# Patient Record
Sex: Female | Born: 1990 | Hispanic: No | Marital: Single | Smoking: Former smoker
Health system: Southern US, Community
[De-identification: ages and names within clinical notes are randomized; demographics above are authoritative.]

## PROBLEM LIST (undated history)

## (undated) DIAGNOSIS — R569 Unspecified convulsions: Secondary | ICD-10-CM

## (undated) DIAGNOSIS — F410 Panic disorder [episodic paroxysmal anxiety] without agoraphobia: Secondary | ICD-10-CM

## (undated) DIAGNOSIS — F445 Conversion disorder with seizures or convulsions: Secondary | ICD-10-CM

## (undated) DIAGNOSIS — G43909 Migraine, unspecified, not intractable, without status migrainosus: Secondary | ICD-10-CM

## (undated) DIAGNOSIS — Z8619 Personal history of other infectious and parasitic diseases: Secondary | ICD-10-CM

## (undated) DIAGNOSIS — F329 Major depressive disorder, single episode, unspecified: Secondary | ICD-10-CM

## (undated) DIAGNOSIS — I519 Heart disease, unspecified: Secondary | ICD-10-CM

## (undated) DIAGNOSIS — J45909 Unspecified asthma, uncomplicated: Secondary | ICD-10-CM

## (undated) HISTORY — DX: Major depressive disorder, single episode, unspecified: F32.9

## (undated) HISTORY — DX: Heart disease, unspecified: I51.9

## (undated) HISTORY — DX: Migraine, unspecified, not intractable, without status migrainosus: G43.909

---
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
## 2012-09-01 ENCOUNTER — Emergency Department: Payer: Self-pay | Admitting: Emergency Medicine

## 2012-09-01 LAB — CBC
HCT: 33.9 % — ABNORMAL LOW (ref 35.0–47.0)
HGB: 10.8 g/dL — ABNORMAL LOW (ref 12.0–16.0)
MCH: 26.4 pg (ref 26.0–34.0)
MCHC: 31.9 g/dL — ABNORMAL LOW (ref 32.0–36.0)
MCV: 83 fL (ref 80–100)
Platelet: 290 10*3/uL (ref 150–440)
RBC: 4.1 10*6/uL (ref 3.80–5.20)
RDW: 17.9 % — ABNORMAL HIGH (ref 11.5–14.5)
WBC: 8.1 10*3/uL (ref 3.6–11.0)

## 2012-09-02 LAB — COMPREHENSIVE METABOLIC PANEL
Albumin: 3.9 g/dL (ref 3.8–5.6)
Alkaline Phosphatase: 106 U/L (ref 82–169)
Anion Gap: 6 — ABNORMAL LOW (ref 7–16)
BUN: 5 mg/dL — ABNORMAL LOW (ref 7–18)
Bilirubin,Total: 0.3 mg/dL (ref 0.2–1.0)
Calcium, Total: 9.2 mg/dL (ref 9.0–10.7)
Chloride: 109 mmol/L — ABNORMAL HIGH (ref 98–107)
Co2: 26 mmol/L (ref 21–32)
Creatinine: 0.79 mg/dL (ref 0.60–1.30)
EGFR (African American): >60
EGFR (Non-African Amer.): >60
Glucose: 65 mg/dL (ref 65–99)
Osmolality: 277 (ref 275–301)
Potassium: 3.3 mmol/L — ABNORMAL LOW (ref 3.5–5.1)
SGOT(AST): 21 U/L (ref 0–26)
SGPT (ALT): 23 U/L (ref 12–78)
Sodium: 141 mmol/L (ref 136–145)
Total Protein: 8.1 g/dL (ref 6.4–8.6)

## 2012-09-02 LAB — URINALYSIS, COMPLETE
Bilirubin,UR: NEGATIVE
Glucose,UR: NEGATIVE mg/dL (ref 0–75)
Ketone: NEGATIVE
Nitrite: NEGATIVE
Ph: 5 (ref 4.5–8.0)
Protein: NEGATIVE
RBC,UR: 4 /HPF (ref 0–5)
Specific Gravity: 1.014 (ref 1.003–1.030)
Squamous Epithelial: 2
WBC UR: 3 /HPF (ref 0–5)

## 2012-09-02 LAB — LIPASE, BLOOD: Lipase: 203 U/L (ref 73–393)

## 2012-12-01 ENCOUNTER — Emergency Department: Payer: Self-pay | Admitting: Emergency Medicine

## 2012-12-01 LAB — URINALYSIS, COMPLETE
Bacteria: NONE SEEN
Bilirubin,UR: NEGATIVE
Glucose,UR: NEGATIVE mg/dL (ref 0–75)
Ketone: NEGATIVE
Leukocyte Esterase: NEGATIVE
Nitrite: NEGATIVE
Ph: 6 (ref 4.5–8.0)
Protein: NEGATIVE
RBC,UR: 1 /HPF (ref 0–5)
Specific Gravity: 1.005 (ref 1.003–1.030)
Squamous Epithelial: 1
WBC UR: 1 /HPF (ref 0–5)

## 2012-12-01 LAB — CBC
HCT: 38.9 % (ref 35.0–47.0)
HGB: 12.7 g/dL (ref 12.0–16.0)
MCH: 27.2 pg (ref 26.0–34.0)
MCHC: 32.6 g/dL (ref 32.0–36.0)
MCV: 83 fL (ref 80–100)
Platelet: 257 10*3/uL (ref 150–440)
RBC: 4.66 10*6/uL (ref 3.80–5.20)
RDW: 17.7 % — ABNORMAL HIGH (ref 11.5–14.5)
WBC: 8.4 10*3/uL (ref 3.6–11.0)

## 2012-12-01 LAB — BASIC METABOLIC PANEL
Anion Gap: 4 — ABNORMAL LOW (ref 7–16)
BUN: 4 mg/dL — ABNORMAL LOW (ref 7–18)
Calcium, Total: 8.9 mg/dL — ABNORMAL LOW (ref 9.0–10.7)
Chloride: 107 mmol/L (ref 98–107)
Co2: 26 mmol/L (ref 21–32)
Creatinine: 0.7 mg/dL (ref 0.60–1.30)
EGFR (African American): >60
EGFR (Non-African Amer.): >60
Glucose: 88 mg/dL (ref 65–99)
Osmolality: 270 (ref 275–301)
Potassium: 3.5 mmol/L (ref 3.5–5.1)
Sodium: 137 mmol/L (ref 136–145)

## 2012-12-02 LAB — HEPATIC FUNCTION PANEL A (ARMC)
Albumin: 3.2 g/dL — ABNORMAL LOW (ref 3.8–5.6)
Alkaline Phosphatase: 109 U/L (ref 82–169)
Bilirubin, Direct: <0.1 mg/dL (ref 0.00–0.20)
Bilirubin,Total: 0.3 mg/dL (ref 0.2–1.0)
SGOT(AST): 19 U/L (ref 0–26)
SGPT (ALT): 28 U/L (ref 12–78)
Total Protein: 7.6 g/dL (ref 6.4–8.6)

## 2012-12-02 LAB — WET PREP, GENITAL

## 2012-12-02 LAB — LIPASE, BLOOD: Lipase: 126 U/L (ref 73–393)

## 2012-12-02 LAB — GC/CHLAMYDIA PROBE AMP

## 2012-12-13 ENCOUNTER — Emergency Department: Payer: Self-pay | Admitting: Emergency Medicine

## 2012-12-13 LAB — URINALYSIS, COMPLETE
Bacteria: NONE SEEN
Bilirubin,UR: NEGATIVE
Glucose,UR: NEGATIVE mg/dL (ref 0–75)
Ketone: NEGATIVE
Leukocyte Esterase: NEGATIVE
Nitrite: NEGATIVE
Ph: 5 (ref 4.5–8.0)
Protein: NEGATIVE
RBC,UR: 40 /HPF (ref 0–5)
Specific Gravity: 1.012 (ref 1.003–1.030)
Squamous Epithelial: 19
WBC UR: 5 /HPF (ref 0–5)

## 2012-12-13 LAB — BASIC METABOLIC PANEL
Anion Gap: 5 — ABNORMAL LOW (ref 7–16)
BUN: 6 mg/dL — ABNORMAL LOW (ref 7–18)
Calcium, Total: 9.2 mg/dL (ref 9.0–10.7)
Chloride: 106 mmol/L (ref 98–107)
Co2: 28 mmol/L (ref 21–32)
Creatinine: 0.85 mg/dL (ref 0.60–1.30)
EGFR (African American): >60
EGFR (Non-African Amer.): >60
Glucose: 82 mg/dL (ref 65–99)
Osmolality: 274 (ref 275–301)
Potassium: 3.4 mmol/L — ABNORMAL LOW (ref 3.5–5.1)
Sodium: 139 mmol/L (ref 136–145)

## 2012-12-13 LAB — DRUG SCREEN, URINE

## 2012-12-13 LAB — PREGNANCY, URINE: Pregnancy Test, Urine: NEGATIVE m[IU]/mL

## 2012-12-14 LAB — CBC WITH DIFFERENTIAL/PLATELET
Basophil #: 0 10*3/uL (ref 0.0–0.1)
Basophil %: 0.4 %
Eosinophil #: 0.1 10*3/uL (ref 0.0–0.7)
Eosinophil %: 1.4 %
HCT: 40.4 % (ref 35.0–47.0)
HGB: 13.4 g/dL (ref 12.0–16.0)
Lymphocyte #: 2.9 10*3/uL (ref 1.0–3.6)
Lymphocyte %: 34 %
MCH: 27.6 pg (ref 26.0–34.0)
MCHC: 33 g/dL (ref 32.0–36.0)
MCV: 84 fL (ref 80–100)
Monocyte #: 0.7 x10 3/mm (ref 0.2–0.9)
Monocyte %: 8.6 %
Neutrophil #: 4.7 10*3/uL (ref 1.4–6.5)
Neutrophil %: 55.6 %
Platelet: 288 10*3/uL (ref 150–440)
RBC: 4.84 10*6/uL (ref 3.80–5.20)
RDW: 17 % — ABNORMAL HIGH (ref 11.5–14.5)
WBC: 8.4 10*3/uL (ref 3.6–11.0)

## 2015-05-27 ENCOUNTER — Emergency Department (HOSPITAL_COMMUNITY)
Admission: EM | Admit: 2015-05-27 | Discharge: 2015-05-27 | Disposition: A | Discharge status: Home / Self Care | Payer: Self-pay | Attending: Emergency Medicine | Admitting: Emergency Medicine

## 2015-05-27 ENCOUNTER — Encounter (HOSPITAL_COMMUNITY): Payer: Self-pay | Admitting: Emergency Medicine

## 2015-05-27 ENCOUNTER — Emergency Department (HOSPITAL_COMMUNITY): Payer: Self-pay

## 2015-05-27 DIAGNOSIS — R404 Transient alteration of awareness: Secondary | ICD-10-CM | POA: Insufficient documentation

## 2015-05-27 DIAGNOSIS — R109 Unspecified abdominal pain: Secondary | ICD-10-CM | POA: Insufficient documentation

## 2015-05-27 DIAGNOSIS — Z3202 Encounter for pregnancy test, result negative: Secondary | ICD-10-CM | POA: Insufficient documentation

## 2015-05-27 HISTORY — DX: Unspecified convulsions: R56.9

## 2015-05-27 HISTORY — DX: Unspecified asthma, uncomplicated: J45.909

## 2015-05-27 HISTORY — DX: Panic disorder (episodic paroxysmal anxiety): F41.0

## 2015-05-27 HISTORY — DX: Conversion disorder with seizures or convulsions: F44.5

## 2015-05-27 LAB — RAPID URINE DRUG SCREEN, HOSP PERFORMED
Amphetamines: NOT DETECTED
Barbiturates: NOT DETECTED
Benzodiazepines: NOT DETECTED
Cocaine: NOT DETECTED
Opiates: NOT DETECTED
Tetrahydrocannabinol: NOT DETECTED

## 2015-05-27 LAB — COMPREHENSIVE METABOLIC PANEL
ALT: 13 U/L — ABNORMAL LOW (ref 14–54)
AST: 17 U/L (ref 15–41)
Albumin: 3.9 g/dL (ref 3.5–5.0)
Alkaline Phosphatase: 82 U/L (ref 38–126)
Anion gap: 8 (ref 5–15)
BUN: 7 mg/dL (ref 6–20)
CO2: 26 mmol/L (ref 22–32)
Calcium: 9.2 mg/dL (ref 8.9–10.3)
Chloride: 105 mmol/L (ref 101–111)
Creatinine, Ser: 0.92 mg/dL (ref 0.44–1.00)
GFR calc Af Amer: >60 mL/min (ref >60)
GFR calc non Af Amer: >60 mL/min (ref >60)
Glucose, Bld: 101 mg/dL — ABNORMAL HIGH (ref 65–99)
Potassium: 3.5 mmol/L (ref 3.5–5.1)
Sodium: 139 mmol/L (ref 135–145)
Total Bilirubin: 0.5 mg/dL (ref 0.3–1.2)
Total Protein: 7.8 g/dL (ref 6.5–8.1)

## 2015-05-27 LAB — CBC WITH DIFFERENTIAL/PLATELET
Basophils Absolute: 0 10*3/uL (ref 0.0–0.1)
Basophils Relative: 0 %
Eosinophils Absolute: 0.1 10*3/uL (ref 0.0–0.7)
Eosinophils Relative: 1 %
HCT: 35.3 % — ABNORMAL LOW (ref 36.0–46.0)
Hemoglobin: 11.2 g/dL — ABNORMAL LOW (ref 12.0–15.0)
Lymphocytes Relative: 39 %
Lymphs Abs: 3.5 10*3/uL (ref 0.7–4.0)
MCH: 27.6 pg (ref 26.0–34.0)
MCHC: 31.7 g/dL (ref 30.0–36.0)
MCV: 86.9 fL (ref 78.0–100.0)
Monocytes Absolute: 0.9 10*3/uL (ref 0.1–1.0)
Monocytes Relative: 9 %
Neutro Abs: 4.6 10*3/uL (ref 1.7–7.7)
Neutrophils Relative %: 51 %
Platelets: 309 10*3/uL (ref 150–400)
RBC: 4.06 MIL/uL (ref 3.87–5.11)
RDW: 15.5 % (ref 11.5–15.5)
WBC: 9 10*3/uL (ref 4.0–10.5)

## 2015-05-27 LAB — I-STAT CHEM 8, ED
BUN: 4 mg/dL — ABNORMAL LOW (ref 6–20)
Calcium, Ion: 1.18 mmol/L (ref 1.12–1.23)
Chloride: 102 mmol/L (ref 101–111)
Creatinine, Ser: 0.9 mg/dL (ref 0.44–1.00)
Glucose, Bld: 100 mg/dL — ABNORMAL HIGH (ref 65–99)
HCT: 39 % (ref 36.0–46.0)
Hemoglobin: 13.3 g/dL (ref 12.0–15.0)
Potassium: 3.5 mmol/L (ref 3.5–5.1)
Sodium: 141 mmol/L (ref 135–145)
TCO2: 25 mmol/L (ref 0–100)

## 2015-05-27 LAB — URINE MICROSCOPIC-ADD ON: RBC / HPF: NONE SEEN RBC/hpf (ref 0–5)

## 2015-05-27 LAB — URINALYSIS, ROUTINE W REFLEX MICROSCOPIC
Bilirubin Urine: NEGATIVE
Glucose, UA: NEGATIVE mg/dL
Hgb urine dipstick: NEGATIVE
Ketones, ur: NEGATIVE mg/dL
Nitrite: NEGATIVE
Protein, ur: NEGATIVE mg/dL
Specific Gravity, Urine: 1.009 (ref 1.005–1.030)
pH: 6.5 (ref 5.0–8.0)

## 2015-05-27 LAB — I-STAT BETA HCG BLOOD, ED (MC, WL, AP ONLY): I-stat hCG, quantitative: <5 m[IU]/mL (ref <5)

## 2015-05-27 LAB — ACETAMINOPHEN LEVEL: Acetaminophen (Tylenol), Serum: <10 ug/mL — ABNORMAL LOW (ref 10–30)

## 2015-05-27 LAB — CBG MONITORING, ED: Glucose-Capillary: 109 mg/dL — ABNORMAL HIGH (ref 65–99)

## 2015-05-27 LAB — ETHANOL: Alcohol, Ethyl (B): <5 mg/dL (ref <5)

## 2015-05-27 LAB — SALICYLATE LEVEL: Salicylate Lvl: <4 mg/dL (ref 2.8–30.0)

## 2015-05-27 MED ORDER — NALOXONE HCL 0.4 MG/ML IJ SOLN
INTRAMUSCULAR | Status: AC
  Administered 2015-05-27: 0.4 mg
  Filled 2015-05-27: qty 1

## 2015-05-27 NOTE — ED Provider Notes (Signed)
CSN: [id number]     Arrival date & time 05/27/15  0018 History  By signing my name below, I, Michelle Lucas, attest that this documentation has been prepared under the direction and in the presence of Crist Kruszka, MD. Electronically Signed: Gonzella LexKimberly Bianca Lucas, Scribe. 05/27/2015. 1:05 AM.   Chief Complaint  Patient presents with  . Seizures   The history is provided by the patient. The history is limited by the condition of the patient. No language interpreter was used.    HPI Comments:  Michelle Lucas is a 25 y.o. female with a hx of seizures and a hx of various psychiatric complaints,who presents to the Emergency Department complaining of sudden onset of abdominal pain which began this evening. Pt was initially brought in to the ED due to suspicion of a seizure. Pt is awake but is not responding to questions she is asked.  Apparently the patient was having sex and enjoying her self per family report and then had an episode.  Patient reported to nurse that she has been seen by neurology and told she does not have seizures and was directed to a counselor.  History reviewed. No pertinent past medical history. No past surgical history on file. History reviewed. No pertinent family history.   Social History  Substance Use Topics  . Smoking status: Not on file  . Smokeless tobacco: Not on file  . Alcohol Use: Not on file   OB History    No data available     Review of Systems  Constitutional: Negative for fever.  Gastrointestinal: Positive for abdominal pain.  Neurological: Negative for dizziness, syncope, facial asymmetry, speech difficulty, light-headedness and numbness.  Hematological: Negative for adenopathy.  All other systems reviewed and are negative.  Allergies  Review of patient's allergies indicates not on file.  Home Medications   Prior to Admission medications   Not on File   There were no vitals taken for this visit. Physical Exam  Constitutional: She is  oriented to person, place, and time. She appears well-developed and well-nourished. No distress.  HENT:  Head: Normocephalic and atraumatic.  Mouth/Throat: Oropharynx is clear and moist.  Intact gag  Eyes: Conjunctivae and EOM are normal. Pupils are equal, round, and reactive to light.  Pinpoint pupils   Neck: Normal range of motion. Neck supple.  Cardiovascular: Normal rate.   Tachycardic but pauses every so often  Pulmonary/Chest: Effort normal. No stridor. No respiratory distress. She has no wheezes. She has no rales.  Lung sounds diminished  Abdominal: Soft. Bowel sounds are normal. She exhibits no distension. There is no tenderness. There is no rebound and no guarding.  Musculoskeletal: Normal range of motion.   Intact dorsalis pedis  Lymphadenopathy:    She has no cervical adenopathy.  Neurological: She is alert and oriented to person, place, and time. She has normal reflexes. No cranial nerve deficit.  Skin: Skin is warm and dry.  Psychiatric: She has a normal mood and affect.  Nursing note and vitals reviewed.   ED Course  Procedures  DIAGNOSTIC STUDIES:       COORDINATION OF CARE:  12:27 AM Will order lab work and will administer narcan in the ED.  Discussed treatment plan with pt at bedside and pt agreed to plan.   Labs Review Labs Reviewed  CBC WITH DIFFERENTIAL/PLATELET - Abnormal; Notable for the following:    Hemoglobin 11.2 (*)    HCT 35.3 (*)    All other components within normal limits  COMPREHENSIVE METABOLIC PANEL - Abnormal; Notable for the following:    Glucose, Bld 101 (*)    ALT 13 (*)    All other components within normal limits  ACETAMINOPHEN LEVEL - Abnormal; Notable for the following:    Acetaminophen (Tylenol), Serum <10 (*)    All other components within normal limits  URINALYSIS, ROUTINE W REFLEX MICROSCOPIC (NOT AT [facility]) - Abnormal; Notable for the following:    Leukocytes, UA TRACE (*)    All other components within normal limits   URINE MICROSCOPIC-ADD ON - Abnormal; Notable for the following:    Squamous Epithelial / LPF 0-5 (*)    Bacteria, UA RARE (*)    All other components within normal limits  I-STAT CHEM 8, ED - Abnormal; Notable for the following:    BUN 4 (*)    Glucose, Bld 100 (*)    All other components within normal limits  CBG MONITORING, ED - Abnormal; Notable for the following:    Glucose-Capillary 109 (*)    All other components within normal limits  URINE RAPID DRUG SCREEN, HOSP PERFORMED  ETHANOL  SALICYLATE LEVEL  I-STAT BETA HCG BLOOD, ED ([facility], [facility], [facility] ONLY)    I have personally reviewed and evaluated these lab results as part of my medical decision-making.  MDM   Final diagnoses:  None    Patient was not unresponsive, when EDP lifted the patient's arm over the patient's head.  The patient held the arm up it did not drop.  Patient sternal rubbed and began complaining of pain immediately.  While I think this is more psychiatric.  No driving until cleared by neurology.  Patient informed of same and it is on the patient's discharge papers  I personally performed the services described in this documentation, which was scribed in my presence. The recorded information has been reviewed and is accurate.        Cy Blamer, MD 05/27/15 [phone]

## 2015-05-27 NOTE — Discharge Instructions (Signed)
NO DRIVING UNTIL CLEARED BY NEUROLOGY

## 2015-05-27 NOTE — ED Notes (Signed)
Verbal order by Dr. Nicanor AlconPalumbo for Narcan

## 2015-05-27 NOTE — ED Notes (Deleted)
Sister stated that pt was having sex with her partner when she had a seizure during intercourse.  Sister stated that patient became unresponsive; pt started to come around but was weak and sister brought her to this facility.

## 2015-05-27 NOTE — ED Notes (Signed)
Sister reported that patient was having intercourse with her partner when patient had an apparent seizure.  Pt was initially unresponsive per sister but she became conscious but weak.  Patient was carried to sister's vehicle and brought to this facility.

## 2015-07-01 ENCOUNTER — Emergency Department
Admission: EM | Admit: 2015-07-01 | Discharge: 2015-07-01 | Disposition: A | Discharge status: Home / Self Care | Payer: Self-pay

## 2015-08-17 ENCOUNTER — Emergency Department: Payer: Self-pay

## 2015-08-17 ENCOUNTER — Emergency Department
Admission: EM | Admit: 2015-08-17 | Discharge: 2015-08-17 | Disposition: A | Discharge status: Home / Self Care | Payer: Self-pay | Attending: Emergency Medicine | Admitting: Emergency Medicine

## 2015-08-17 ENCOUNTER — Encounter: Payer: Self-pay | Admitting: *Deleted

## 2015-08-17 DIAGNOSIS — F17200 Nicotine dependence, unspecified, uncomplicated: Secondary | ICD-10-CM | POA: Insufficient documentation

## 2015-08-17 DIAGNOSIS — F1290 Cannabis use, unspecified, uncomplicated: Secondary | ICD-10-CM | POA: Insufficient documentation

## 2015-08-17 DIAGNOSIS — Z8669 Personal history of other diseases of the nervous system and sense organs: Secondary | ICD-10-CM | POA: Insufficient documentation

## 2015-08-17 DIAGNOSIS — M79672 Pain in left foot: Secondary | ICD-10-CM | POA: Insufficient documentation

## 2015-08-17 DIAGNOSIS — J45909 Unspecified asthma, uncomplicated: Secondary | ICD-10-CM | POA: Insufficient documentation

## 2015-08-17 MED ORDER — IBUPROFEN 800 MG PO TABS: 800.0000 mg | ORAL_TABLET | Freq: Three times a day (TID) | ORAL | Status: DC | PRN

## 2015-08-17 NOTE — ED Notes (Signed)
Pt to ed with c/o left heel pain and left ankle pain and swelling, pt denies injury but states pain worse with ambulation.

## 2015-08-17 NOTE — Discharge Instructions (Signed)
Cryotherapy °Cryotherapy means treatment with cold. Ice or gel packs can be used to reduce both pain and swelling. Ice is the most helpful within the first 24 to 48 hours after an injury or flare-up from overusing a muscle or joint. Sprains, strains, spasms, burning pain, shooting pain, and aches can all be eased with ice. Ice can also be used when recovering from surgery. Ice is effective, has very few side effects, and is safe for most people to use. °PRECAUTIONS  °Ice is not a safe treatment option for people with: °· Raynaud phenomenon. This is a condition affecting small blood vessels in the extremities. Exposure to cold may cause your problems to return. °· Cold hypersensitivity. There are many forms of cold hypersensitivity, including: °· Cold urticaria. Red, itchy hives appear on the skin when the tissues begin to warm after being iced. °· Cold erythema. This is a red, itchy rash caused by exposure to cold. °· Cold hemoglobinuria. Red blood cells break down when the tissues begin to warm after being iced. The hemoglobin that carry oxygen are passed into the urine because they cannot combine with blood proteins fast enough. °· Numbness or altered sensitivity in the area being iced. °If you have any of the following conditions, do not use ice until you have discussed cryotherapy with your caregiver: °· Heart conditions, such as arrhythmia, angina, or chronic heart disease. °· High blood pressure. °· Healing wounds or open skin in the area being iced. °· Current infections. °· Rheumatoid arthritis. °· Poor circulation. °· Diabetes. °Ice slows the blood flow in the region it is applied. This is beneficial when trying to stop inflamed tissues from spreading irritating chemicals to surrounding tissues. However, if you expose your skin to cold temperatures for too long or without the proper protection, you can damage your skin or nerves. Watch for signs of skin damage due to cold. °HOME CARE INSTRUCTIONS °Follow  these tips to use ice and cold packs safely. °· Place a dry or damp towel between the ice and skin. A damp towel will cool the skin more quickly, so you may need to shorten the time that the ice is used. °· For a more rapid response, add gentle compression to the ice. °· Ice for no more than 10 to 20 minutes at a time. The bonier the area you are icing, the less time it will take to get the benefits of ice. °· Check your skin after 5 minutes to make sure there are no signs of a poor response to cold or skin damage. °· Rest 20 minutes or more between uses. °· Once your skin is numb, you can end your treatment. You can test numbness by very lightly touching your skin. The touch should be so light that you do not see the skin dimple from the pressure of your fingertip. When using ice, most people will feel these normal sensations in this order: cold, burning, aching, and numbness. °· Do not use ice on someone who cannot communicate their responses to pain, such as small children or people with dementia. °HOW TO MAKE AN ICE PACK °Ice packs are the most common way to use ice therapy. Other methods include ice massage, ice baths, and cryosprays. Muscle creams that cause a cold, tingly feeling do not offer the same benefits that ice offers and should not be used as a substitute unless recommended by your caregiver. °To make an ice pack, do one of the following: °· Place crushed ice or a   bag of frozen vegetables in a sealable plastic bag. Squeeze out the excess air. Place this bag inside another plastic bag. Slide the bag into a pillowcase or place a damp towel between your skin and the bag.  Mix 3 parts water with 1 part rubbing alcohol. Freeze the mixture in a sealable plastic bag. When you remove the mixture from the freezer, it will be slushy. Squeeze out the excess air. Place this bag inside another plastic bag. Slide the bag into a pillowcase or place a damp towel between your skin and the bag. SEEK MEDICAL CARE  IF:  You develop white spots on your skin. This may give the skin a blotchy (mottled) appearance.  Your skin turns blue or pale.  Your skin becomes waxy or hard.  Your swelling gets worse. MAKE SURE YOU:   Understand these instructions.  Will watch your condition.  Will get help right away if you are not doing well or get worse.   This information is not intended to replace advice given to you by your health care provider. Make sure you discuss any questions you have with your health care provider.   Document Released: 10/29/2010 Document Revised: 03/25/2014 Document Reviewed: 10/29/2010 Elsevier Interactive Patient Education 2016 Elsevier Inc.  Musculoskeletal Pain Musculoskeletal pain is muscle and boney aches and pains. These pains can occur in any part of the body. Your caregiver may treat you without knowing the cause of the pain. They may treat you if blood or urine tests, X-rays, and other tests were normal.  CAUSES There is often not a definite cause or reason for these pains. These pains may be caused by a type of germ (virus). The discomfort may also come from overuse. Overuse includes working out too hard when your body is not fit. Boney aches also come from weather changes. Bone is sensitive to atmospheric pressure changes. HOME CARE INSTRUCTIONS   Ask when your test results will be ready. Make sure you get your test results.  Only take over-the-counter or prescription medicines for pain, discomfort, or fever as directed by your caregiver. If you were given medications for your condition, do not drive, operate machinery or power tools, or sign legal documents for 24 hours. Do not drink alcohol. Do not take sleeping pills or other medications that may interfere with treatment.  Continue all activities unless the activities cause more pain. When the pain lessens, slowly resume normal activities. Gradually increase the intensity and duration of the activities or  exercise.  During periods of severe pain, bed rest may be helpful. Lay or sit in any position that is comfortable.  Putting ice on the injured area.  Put ice in a bag.  Place a towel between your skin and the bag.  Leave the ice on for 15 to 20 minutes, 3 to 4 times a day.  Follow up with your caregiver for continued problems and no reason can be found for the pain. If the pain becomes worse or does not go away, it may be necessary to repeat tests or do additional testing. Your caregiver may need to look further for a possible cause. SEEK IMMEDIATE MEDICAL CARE IF:  You have pain that is getting worse and is not relieved by medications.  You develop chest pain that is associated with shortness or breath, sweating, feeling sick to your stomach (nauseous), or throw up (vomit).  Your pain becomes localized to the abdomen.  You develop any new symptoms that seem different or that concern  you. MAKE SURE YOU:   Understand these instructions.  Will watch your condition.  Will get help right away if you are not doing well or get worse.   This information is not intended to replace advice given to you by your health care provider. Make sure you discuss any questions you have with your health care provider.   Document Released: 03/04/2005 Document Revised: 05/27/2011 Document Reviewed: 11/06/2012 Elsevier Interactive Patient Education 2016 Elsevier Inc.  Foot LockerHeat Therapy Heat therapy can help ease sore, stiff, injured, and tight muscles and joints. Heat relaxes your muscles, which may help ease your pain.  RISKS AND COMPLICATIONS If you have any of the following conditions, do not use heat therapy unless your health care provider has approved:  Poor circulation.  Healing wounds or scarred skin in the area being treated.  Diabetes, heart disease, or high blood pressure.  Not being able to feel (numbness) the area being treated.  Unusual swelling of the area being treated.  Active  infections.  Blood clots.  Cancer.  Inability to communicate pain. This may include young children and people who have problems with their brain function (dementia).  Pregnancy. Heat therapy should only be used on old, pre-existing, or long-lasting (chronic) injuries. Do not use heat therapy on new injuries unless directed by your health care provider. HOW TO USE HEAT THERAPY There are several different kinds of heat therapy, including:  Moist heat pack.  Warm water bath.  Hot water bottle.  Electric heating pad.  Heated gel pack.  Heated wrap.  Electric heating pad. Use the heat therapy method suggested by your health care provider. Follow your health care provider's instructions on when and how to use heat therapy. GENERAL HEAT THERAPY RECOMMENDATIONS  Do not sleep while using heat therapy. Only use heat therapy while you are awake.  Your skin may turn pink while using heat therapy. Do not use heat therapy if your skin turns red.  Do not use heat therapy if you have new pain.  High heat or long exposure to heat can cause burns. Be careful when using heat therapy to avoid burning your skin.  Do not use heat therapy on areas of your skin that are already irritated, such as with a rash or sunburn. SEEK MEDICAL CARE IF:  You have blisters, redness, swelling, or numbness.  You have new pain.  Your pain is worse. MAKE SURE YOU:  Understand these instructions.  Will watch your condition.  Will get help right away if you are not doing well or get worse.   This information is not intended to replace advice given to you by your health care provider. Make sure you discuss any questions you have with your health care provider.   Document Released: 05/27/2011 Document Revised: 03/25/2014 Document Reviewed: 04/27/2013 Elsevier Interactive Patient Education [employer] Elsevier Inc.

## 2015-08-17 NOTE — ED Notes (Signed)
Pt injured left foot, complains of pain, pt is able to ambulate on foot

## 2015-08-17 NOTE — ED Notes (Signed)
Applied ace bandage to left ankle/foot.  Pt tolerated well.  Explained d/c instructions but unable to have pt sign due to computer issues.

## 2015-08-17 NOTE — ED Provider Notes (Signed)
[facility]  ____________________________________________  Time seen: Approximately 10:52 AM  I have reviewed the triage vital signs and the nursing notes.   HISTORY  Chief Complaint Foot Injury    HPI Michelle Lucas is a 25 y.o. female presents for evaluation of left foot pain. Patient is unsure of injury or mechanism of pain complaints of pain around the heel. Is able to ambulate but ambulates without difficulty.   Past Medical History  Diagnosis Date  . Asthma   . Anxiety attack   . Pseudoseizures     There are no active problems to display for this patient.   History reviewed. No pertinent past surgical history.  Current Outpatient Rx  Name  Route  Sig  Dispense  Refill  . ibuprofen (ADVIL,MOTRIN) 800 MG tablet   Oral   Take 1 tablet (800 mg total) by mouth every 8 (eight) hours as needed.   30 tablet   0     Allergies Review of patient's allergies indicates no known allergies.  No family history on file.  Social History Social History  Substance Use Topics  . Smoking status: Current Some Day Smoker  . Smokeless tobacco: Never Used  . Alcohol Use: Yes    Review of Systems Constitutional: No fever/chills Musculoskeletal: Positive for left foot pain. Skin: Negative for rash. Neurological: Negative for headaches, focal weakness or numbness.  10-point ROS otherwise negative.  ____________________________________________   PHYSICAL EXAM:  VITAL SIGNS: ED Triage Vitals  Enc Vitals Group     BP --      Pulse --      Resp --      Temp --      Temp src --      SpO2 --      Weight --      Height --      Head Cir --      Peak Flow --      Pain Score 08/17/15 1046 10     Pain Loc --      Pain Edu? --      Excl. in GC? --     Constitutional: Alert and oriented. Well appearing and in no acute distress. Musculoskeletal: Point tenderness around the left heel and dorsum of the foot. No  ecchymosis or bruising. Distally neurovascularly intact. Neurologic:  Normal speech and language. No gross focal neurologic deficits are appreciated. No gait instability. Skin:  Skin is warm, dry and intact. No rash noted. Psychiatric: Mood and affect are normal. Speech and behavior are normal.  ____________________________________________   LABS (all labs ordered are listed, but only abnormal results are displayed)  Labs Reviewed - No data to display ____________________________________________  EKG   ____________________________________________  RADIOLOGY  No acute osseous findings. ____________________________________________   PROCEDURES  Procedure(s) performed: None  Critical Care performed: No  ____________________________________________   INITIAL IMPRESSION / ASSESSMENT AND PLAN / ED COURSE  Pertinent labs & imaging results that were available during my care of the patient were reviewed by me and considered in my medical decision making (see chart for details).  Left foot pain. Rx given for ibuprofen 800 mg Ace wrap provided for comfort. Patient follow-up with podiatry if continued pain within a week. ____________________________________________   FINAL CLINICAL IMPRESSION(S) / ED DIAGNOSES  Final diagnoses:  Left foot pain     This chart was dictated using voice recognition software/Dragon. Despite best efforts to proofread, errors can occur which can change the  meaning. Any change was purely unintentional.   Evangeline Dakin, PA-C 08/17/15 1224  Emily Filbert, MD 08/17/15 1345

## 2015-11-06 ENCOUNTER — Encounter (HOSPITAL_COMMUNITY): Payer: Self-pay | Admitting: *Deleted

## 2015-11-06 ENCOUNTER — Emergency Department (HOSPITAL_COMMUNITY)
Admission: EM | Admit: 2015-11-06 | Discharge: 2015-11-06 | Disposition: A | Discharge status: Home / Self Care | Payer: Self-pay | Attending: Emergency Medicine | Admitting: Emergency Medicine

## 2015-11-06 DIAGNOSIS — N760 Acute vaginitis: Secondary | ICD-10-CM | POA: Insufficient documentation

## 2015-11-06 DIAGNOSIS — N898 Other specified noninflammatory disorders of vagina: Secondary | ICD-10-CM | POA: Insufficient documentation

## 2015-11-06 DIAGNOSIS — F17200 Nicotine dependence, unspecified, uncomplicated: Secondary | ICD-10-CM | POA: Insufficient documentation

## 2015-11-06 DIAGNOSIS — R1030 Lower abdominal pain, unspecified: Secondary | ICD-10-CM

## 2015-11-06 DIAGNOSIS — Z79899 Other long term (current) drug therapy: Secondary | ICD-10-CM | POA: Insufficient documentation

## 2015-11-06 DIAGNOSIS — B9689 Other specified bacterial agents as the cause of diseases classified elsewhere: Secondary | ICD-10-CM | POA: Insufficient documentation

## 2015-11-06 DIAGNOSIS — J45909 Unspecified asthma, uncomplicated: Secondary | ICD-10-CM | POA: Insufficient documentation

## 2015-11-06 LAB — CBC
HCT: 36.7 % (ref 36.0–46.0)
Hemoglobin: 11.8 g/dL — ABNORMAL LOW (ref 12.0–15.0)
MCH: 27.3 pg (ref 26.0–34.0)
MCHC: 32.2 g/dL (ref 30.0–36.0)
MCV: 85 fL (ref 78.0–100.0)
Platelets: 247 10*3/uL (ref 150–400)
RBC: 4.32 MIL/uL (ref 3.87–5.11)
RDW: 15.9 % — ABNORMAL HIGH (ref 11.5–15.5)
WBC: 6.3 10*3/uL (ref 4.0–10.5)

## 2015-11-06 LAB — WET PREP, GENITAL
Sperm: NONE SEEN
Trich, Wet Prep: NONE SEEN
Yeast Wet Prep HPF POC: NONE SEEN

## 2015-11-06 LAB — URINALYSIS, ROUTINE W REFLEX MICROSCOPIC
Bilirubin Urine: NEGATIVE
Glucose, UA: NEGATIVE mg/dL
Hgb urine dipstick: NEGATIVE
Ketones, ur: NEGATIVE mg/dL
Nitrite: NEGATIVE
Protein, ur: NEGATIVE mg/dL
Specific Gravity, Urine: 1.016 (ref 1.005–1.030)
pH: 6 (ref 5.0–8.0)

## 2015-11-06 LAB — COMPREHENSIVE METABOLIC PANEL
ALT: 13 U/L — ABNORMAL LOW (ref 14–54)
AST: 16 U/L (ref 15–41)
Albumin: 3.5 g/dL (ref 3.5–5.0)
Alkaline Phosphatase: 81 U/L (ref 38–126)
Anion gap: 6 (ref 5–15)
BUN: <5 mg/dL — ABNORMAL LOW (ref 6–20)
CO2: 24 mmol/L (ref 22–32)
Calcium: 9.1 mg/dL (ref 8.9–10.3)
Chloride: 107 mmol/L (ref 101–111)
Creatinine, Ser: 0.79 mg/dL (ref 0.44–1.00)
GFR calc Af Amer: >60 mL/min (ref >60)
GFR calc non Af Amer: >60 mL/min (ref >60)
Glucose, Bld: 93 mg/dL (ref 65–99)
Potassium: 3.4 mmol/L — ABNORMAL LOW (ref 3.5–5.1)
Sodium: 137 mmol/L (ref 135–145)
Total Bilirubin: 0.8 mg/dL (ref 0.3–1.2)
Total Protein: 7.3 g/dL (ref 6.5–8.1)

## 2015-11-06 LAB — URINE MICROSCOPIC-ADD ON: RBC / HPF: NONE SEEN RBC/hpf (ref 0–5)

## 2015-11-06 LAB — I-STAT BETA HCG BLOOD, ED (MC, WL, AP ONLY): I-stat hCG, quantitative: <5 m[IU]/mL (ref <5)

## 2015-11-06 LAB — LIPASE, BLOOD: Lipase: 19 U/L (ref 11–51)

## 2015-11-06 MED ORDER — STERILE WATER FOR INJECTION IJ SOLN
INTRAMUSCULAR | Status: AC
  Administered 2015-11-06: 10 mL
  Filled 2015-11-06: qty 10

## 2015-11-06 MED ORDER — METRONIDAZOLE 500 MG PO TABS: 500.0000 mg | ORAL_TABLET | Freq: Two times a day (BID) | ORAL | 0 refills | Status: DC

## 2015-11-06 MED ORDER — CEFTRIAXONE SODIUM 250 MG IJ SOLR
250.0000 mg | Freq: Once | INTRAMUSCULAR | Status: AC
  Administered 2015-11-06: 250 mg via INTRAMUSCULAR
  Filled 2015-11-06: qty 250

## 2015-11-06 MED ORDER — AZITHROMYCIN 250 MG PO TABS
1000.0000 mg | ORAL_TABLET | Freq: Once | ORAL | Status: AC
  Administered 2015-11-06: 1000 mg via ORAL
  Filled 2015-11-06: qty 4

## 2015-11-06 NOTE — ED Provider Notes (Signed)
[facility]-EMERGENCY DEPT Provider Note   CSN: [id number] Arrival date & time: 11/06/15  1012     History   Chief Complaint Chief Complaint  Patient presents with  . Possible Pregnancy  . Abdominal Pain    HPI Michelle Lucas is a 25 y.o. female.  Michelle Lucas is a 25 y.o. female with h/o asthma, pseudoseizures, and anxiety presents to ED with complaint of lower abdominal pain and N/V. Patient states she started experiencing symptoms approximately 1 week ago. Abdominal pain is located in the suprapubic and RLQ; it is intermittent, cramping 7-8/10, worse when laying on stomach, and improved with laying on side. She has had associated nausea, vomtiing, diaphoresis, and subjective fevers as well. She is eating and drinking as normal. She denies diarrhea or constipation. She was concerned about possible pregnancy - she took two pregnancy tests at home - one was positive, the other negative. She is sexually active with one female partner in the last 6 months with intermittent use of barrier protection. No other contraception used. LMP was mid July 2017; however, reports cycles are irregular. Denies gynecologic conditions or procedures. No abdominal conditions or surgeries.       Past Medical History:  Diagnosis Date  . Anxiety attack   . Asthma   . Pseudoseizures     There are no active problems to display for this patient.   History reviewed. No pertinent surgical history.  OB History    No data available       Home Medications    Prior to Admission medications   Medication Sig Start Date End Date Taking? Authorizing Provider  ibuprofen (ADVIL,MOTRIN) 800 MG tablet Take 1 tablet (800 mg total) by mouth every 8 (eight) hours as needed. Patient not taking: Reported on 11/06/2015 08/17/15   Charmayne Sheer Beers, PA-C  metroNIDAZOLE (FLAGYL) 500 MG tablet Take 1 tablet (500 mg total) by mouth 2 (two) times daily. 11/06/15   Lona Kettle, PA-C    Family History History reviewed. No  pertinent family history.  Social History Social History  Substance Use Topics  . Smoking status: Current Some Day Smoker  . Smokeless tobacco: Never Used  . Alcohol use Yes     Allergies   Review of patient's allergies indicates no known allergies.   Review of Systems Review of Systems  Constitutional: Positive for diaphoresis and fever ( subjective). Negative for chills.  HENT: Negative for sore throat.   Eyes: Negative for visual disturbance.  Respiratory: Negative for shortness of breath.   Cardiovascular: Negative for chest pain.  Gastrointestinal: Positive for abdominal pain, nausea and vomiting. Negative for constipation and diarrhea.  Genitourinary: Negative for dysuria, hematuria, pelvic pain, vaginal bleeding, vaginal discharge and vaginal pain.  Musculoskeletal: Negative for neck pain.  Skin: Negative for rash.  Neurological: Negative for syncope.     Physical Exam Updated Vital Signs BP 119/86 (BP Location: Left Arm)   Pulse 93   Temp 98.6 F (37 C) (Oral)   Resp 16   LMP 09/27/2015   SpO2 100%   Physical Exam  Constitutional: She appears well-developed and well-nourished. No distress.  HENT:  Head: Normocephalic and atraumatic.  Mouth/Throat: Oropharynx is clear and moist. No oropharyngeal exudate.  Eyes: Conjunctivae and EOM are normal. Pupils are equal, round, and reactive to light. Right eye exhibits no discharge. Left eye exhibits no discharge. No scleral icterus.  Neck: Normal range of motion. Neck supple.  Cardiovascular: Normal rate, regular rhythm, normal heart sounds and  intact distal pulses.   No murmur heard. Pulmonary/Chest: Effort normal and breath sounds normal. No respiratory distress.  Abdominal: Soft. Bowel sounds are normal. There is tenderness in the right lower quadrant and suprapubic area. There is no rigidity, no rebound, no guarding and negative Murphy's sign.  Genitourinary: Pelvic exam was performed with patient supine.    Genitourinary Comments: Chaperone present for duration of exam. External anatomy normal - no injury, lesions, masses, or rashes. No bleeding, lesions, masses, or ulcerations in vaginal cavity. Cervix is closed with scant white discharge. No friability. No CMT, no adnexal tenderness, no masses palpated on bimanual exam.   Musculoskeletal: Normal range of motion.  Lymphadenopathy:    She has no cervical adenopathy.  Neurological: She is alert. Coordination normal.  Skin: Skin is warm and dry. She is not diaphoretic.  Psychiatric: She has a normal mood and affect.     ED Treatments / Results  Labs (all labs ordered are listed, but only abnormal results are displayed) Labs Reviewed  WET PREP, GENITAL - Abnormal; Notable for the following:       Result Value   Clue Cells Wet Prep HPF POC PRESENT (*)    WBC, Wet Prep HPF POC MODERATE (*)    All other components within normal limits  COMPREHENSIVE METABOLIC PANEL - Abnormal; Notable for the following:    Potassium 3.4 (*)    BUN <5 (*)    ALT 13 (*)    All other components within normal limits  CBC - Abnormal; Notable for the following:    Hemoglobin 11.8 (*)    RDW 15.9 (*)    All other components within normal limits  URINALYSIS, ROUTINE W REFLEX MICROSCOPIC (NOT AT [facility]) - Abnormal; Notable for the following:    APPearance CLOUDY (*)    Leukocytes, UA SMALL (*)    All other components within normal limits  URINE MICROSCOPIC-ADD ON - Abnormal; Notable for the following:    Squamous Epithelial / LPF 6-30 (*)    Bacteria, UA FEW (*)    All other components within normal limits  URINE CULTURE  LIPASE, BLOOD  I-STAT BETA HCG BLOOD, ED ([facility], [facility], [facility] ONLY)  GC/CHLAMYDIA PROBE AMP () NOT AT [facility]    EKG  EKG Interpretation None       Radiology No results found.  Procedures Procedures (including critical care time)  Medications Ordered in ED Medications  azithromycin (ZITHROMAX) tablet 1,000 mg (1,000 mg Oral  Given 11/06/15 1417)  cefTRIAXone (ROCEPHIN) injection 250 mg (250 mg Intramuscular Given 11/06/15 1418)  sterile water (preservative free) injection (10 mLs  Given 11/06/15 1418)     Initial Impression / Assessment and Plan / ED Course  I have reviewed the triage vital signs and the nursing notes.  Pertinent labs & imaging results that were available during my care of the patient were reviewed by me and considered in my medical decision making (see chart for details).  Clinical Course  Comment By Time  On re-evaluation, patient endorses no nausea or abdominal pain. Abdomen is non-tender and soft.  Lona Kettleshley Laurel Meyer, [place]-C 08/21 1416    Patient is afebrile and non-toxic appearing in NAD. Vital signs are stable. Physical exam remarkable for mild TTP of suprapubic and RLQ with no guarding, rigidity, or peritoneal signs.  Pelvic exam remarkable for scant white discharge, cervix is closed no CMT. Pregnancy test negative - low suspicion for ectopic. CMP and CBC re-assuring. Low suspicion for acute cholecystitis - nml  LFTs, no RUQ TTP. Lipase normal - low suspicion for pancreatitis. Abdomen soft with positive bowel sounds and no peritoneal signs - doubt obstruction or perforation. Patient appetite nml, afebrile, no leukocytosis, no guarding/rigidity - low suspicion for appendicitis. No pelvic pain/vaginal pain, no tenderness or masses palpated on bimanual - low suspicion for torsion or TOA at this time. U/A shows small leukocytes; however, specimen appears contaminated, pt asx will culture and hold on ABX. Wet prep positive for BV, will tx with ABX. Patient requesting empiric tx for STIs. IM ceftriaxone and PO azithromycin given.   On re-evaluation patient endorses resolution of nausea and abdominal pain. Abdomen is soft and non-tender. Unclear etiology of abdominal pain - patient does state she has BM only 2 x weekly and that they are hard and she has to strain to pass - ?constipation. Discussed results  with patient. Rx ABX for BV. Try miralax to soften stool. Establish a PCP, [facility]. Strict return precautions discussed. Patient voiced understanding and is agreeable.    Final Clinical Impressions(s) / ED Diagnoses   Final diagnoses:  Lower abdominal pain  Bacterial vaginosis    New Prescriptions New Prescriptions   METRONIDAZOLE (FLAGYL) 500 MG TABLET    Take 1 tablet (500 mg total) by mouth 2 (two) times daily.     Lona KettleAshley Laurel Meyer, [place]-C 11/06/15 1444    Cathren LaineKevin Steinl, MD 11/06/15 [phone]

## 2015-11-06 NOTE — Discharge Instructions (Signed)
Read the information below.   Your pregnancy test was negative. Your labs were re-assuring. You have bacterial vaginosis. You are being treated with an antibiotic, take as prescribed. Discontinue and return to ED if you develop a rash or difficulty breathing.  You can take tylenol or motrin for pain relief.  You decided to be treated today for STIs. Please notify your sexual partners and make sure they are tested and treated as well. To prevent STIs it is important that you use barrier protection (condoms) every time.  Use the prescribed medication as directed.  Please discuss all new medications with your pharmacist.  You may return to the Emergency Department at any time for worsening condition or any new symptoms that concern you. Return to ED if you develop fever, localized/persistent abdominal pain, inability to keep food/fluids down, vaginal bleeding, or unable to control pain at home.

## 2015-11-06 NOTE — ED Triage Notes (Signed)
Pt reports having mild abd discomfort for several days, along with n/v. Reports no menstrual cycle x 1 month, has not taken home preg test.

## 2015-11-07 LAB — GC/CHLAMYDIA PROBE AMP (~~LOC~~) NOT AT ARMC
Chlamydia: NEGATIVE
Neisseria Gonorrhea: NEGATIVE

## 2015-11-07 LAB — URINE CULTURE

## 2016-02-02 ENCOUNTER — Inpatient Hospital Stay (HOSPITAL_COMMUNITY)
Admission: AD | Admit: 2016-02-02 | Discharge: 2016-02-02 | Disposition: A | Discharge status: Home / Self Care | Payer: Self-pay | Source: Ambulatory Visit | Attending: Obstetrics & Gynecology | Admitting: Obstetrics & Gynecology

## 2016-02-02 ENCOUNTER — Inpatient Hospital Stay (HOSPITAL_COMMUNITY): Payer: Self-pay

## 2016-02-02 ENCOUNTER — Encounter (HOSPITAL_COMMUNITY): Payer: Self-pay

## 2016-02-02 DIAGNOSIS — R1032 Left lower quadrant pain: Secondary | ICD-10-CM

## 2016-02-02 DIAGNOSIS — R102 Pelvic and perineal pain unspecified side: Secondary | ICD-10-CM

## 2016-02-02 DIAGNOSIS — N760 Acute vaginitis: Secondary | ICD-10-CM

## 2016-02-02 DIAGNOSIS — F17200 Nicotine dependence, unspecified, uncomplicated: Secondary | ICD-10-CM | POA: Insufficient documentation

## 2016-02-02 DIAGNOSIS — B9689 Other specified bacterial agents as the cause of diseases classified elsewhere: Secondary | ICD-10-CM | POA: Insufficient documentation

## 2016-02-02 DIAGNOSIS — K5900 Constipation, unspecified: Secondary | ICD-10-CM

## 2016-02-02 LAB — WET PREP, GENITAL
Sperm: NONE SEEN
Trich, Wet Prep: NONE SEEN
Yeast Wet Prep HPF POC: NONE SEEN

## 2016-02-02 LAB — URINE MICROSCOPIC-ADD ON

## 2016-02-02 LAB — POCT PREGNANCY, URINE
Preg Test, Ur: NEGATIVE
Preg Test, Ur: NEGATIVE

## 2016-02-02 LAB — URINALYSIS, ROUTINE W REFLEX MICROSCOPIC
Bilirubin Urine: NEGATIVE
Glucose, UA: NEGATIVE mg/dL
Ketones, ur: NEGATIVE mg/dL
Leukocytes, UA: NEGATIVE
Nitrite: NEGATIVE
Protein, ur: NEGATIVE mg/dL
Specific Gravity, Urine: <1.005 — ABNORMAL LOW (ref 1.005–1.030)
pH: 7 (ref 5.0–8.0)

## 2016-02-02 MED ORDER — DOCUSATE SODIUM 100 MG PO CAPS: 100.0000 mg | ORAL_CAPSULE | Freq: Two times a day (BID) | ORAL | 0 refills | Status: DC

## 2016-02-02 MED ORDER — POLYETHYLENE GLYCOL 3350 17 G PO PACK: 17.0000 g | PACK | Freq: Two times a day (BID) | ORAL | 0 refills | Status: DC

## 2016-02-02 MED ORDER — METRONIDAZOLE 500 MG PO TABS: 500.0000 mg | ORAL_TABLET | Freq: Two times a day (BID) | ORAL | 0 refills | Status: DC

## 2016-02-02 MED ORDER — KETOROLAC TROMETHAMINE 60 MG/2ML IM SOLN
60.0000 mg | Freq: Once | INTRAMUSCULAR | Status: AC
  Administered 2016-02-02: 60 mg via INTRAMUSCULAR
  Filled 2016-02-02: qty 2

## 2016-02-02 NOTE — Progress Notes (Addendum)
Was told has cyst on left ovary in September by [facility], pain has persisted on and off, pressure with urination, no burning. Patient states she wants the pain gone and needs a note for work.  Also she says she has seizures but has been told they are pseudo seizures.

## 2016-02-02 NOTE — MAU Provider Note (Signed)
History     CSN: [id number]  Arrival date and time: 02/02/16 [id number]   First Provider Initiated Contact with Patient 02/02/16 0734      Chief Complaint  Patient presents with  . Pelvic Pain   Michelle Lucas is a 25 y.o. [id number] who presents today with left sided pelvic pain. She [facility], and was told she has a cyst. She states that the pain is worse. There is no record of the visit in September.    Pelvic Pain  The patient's primary symptoms include pelvic pain. This is a new problem. The current episode started more than 1 month ago. The problem occurs intermittently. The problem has been gradually worsening. Pain severity now: 8/10. The problem affects the left side. She is not pregnant. Associated symptoms include abdominal pain. Pertinent negatives include no chills, constipation, diarrhea, dysuria, fever, frequency, nausea, urgency or vomiting. The vaginal discharge was normal. The vaginal bleeding is spotting. Exacerbated by: cold air, red dye  She has tried NSAIDs for the symptoms. The treatment provided no relief. She is sexually active. She uses nothing for contraception. Menstrual history: LMP: 01/27/16     Past Medical History:  Diagnosis Date  . Anxiety attack   . Asthma   . Pseudoseizures     History reviewed. No pertinent surgical history.  No family history on file.  Social History  Substance Use Topics  . Smoking status: Current Some Day Smoker  . Smokeless tobacco: Never Used  . Alcohol use Yes    Allergies: No Known Allergies  Prescriptions Prior to Admission  Medication Sig Dispense Refill Last Dose  . ibuprofen (ADVIL,MOTRIN) 800 MG tablet Take 1 tablet (800 mg total) by mouth every 8 (eight) hours as needed. (Patient not taking: Reported on 11/06/2015) 30 tablet 0 Completed Course at Unknown time  . metroNIDAZOLE (FLAGYL) 500 MG tablet Take 1 tablet (500 mg total) by mouth 2 (two) times daily. 14 tablet 0     Review  of Systems  Constitutional: Negative for chills and fever.  Gastrointestinal: Positive for abdominal pain. Negative for constipation, diarrhea, nausea and vomiting.  Genitourinary: Positive for pelvic pain. Negative for dysuria, frequency and urgency.       Some pressure when urinating.    Physical Exam   Blood pressure 116/74, pulse 78, temperature 98.1 F (36.7 C), resp. rate 16, height 5\' 5"  (1.651 m), weight 192 lb 1.3 oz (87.1 kg), last menstrual period 01/27/2016.  Physical Exam  Nursing note and vitals reviewed. Constitutional: She is oriented to person, place, and time. She appears well-developed and well-nourished. No distress.  HENT:  Head: Normocephalic.  Cardiovascular: Normal rate.   Respiratory: Effort normal.  GI: Soft. There is no tenderness. There is no rebound.  Genitourinary:  Genitourinary Comments: Cervix: no CMT  Uterus: Normal size, shape Adnexa: no masses   Neurological: She is alert and oriented to person, place, and time.  Skin: Skin is warm and dry.  Psychiatric: She has a normal mood and affect.    MAU Course  Procedures  MDM Patient has had toradol US pending Wet prep pending 0800 Care turned over to E. Mumaw DO Tawnya CrookHogan, Heather Donovan  7:50 AM 02/02/2016   7:55 AM - Resumed care of patient, awaiting transfer to US.  EOmer Jack. Mumaw, DO  9:41 AM - Spoke with patient, regarding results of US and no cyst found on ovary. Mentioned to patient regarding normal physiologic cysts (follicles). Patient is  adament that she has a cyst and [facility] and that the ER doctor keeps calling to "check in on her" and letting her know that she will have to have surgery soon. Discussed with patient that an ER doctor is unlikely to be setting up a surgery for a patient, and would [facility] and request the US results and bring them to an OB/GYN to review.  Also discussed with patient other causes of  her left sided pain, such as constipation. Patient has not had a BM in "a while" (could not quantify days), and states that she strains and has hard stools.   Assessment and Plan   1) Abdominal pain, left lower quadrant 2) Constipation 3) Bacterial vaginosis  Discharged home in stable condition A print out of patient's ultrasound was given to the patient. Recommend high fiber diet, hydration, and a bowel regimen to help with constipation IBuprofen for pain Flagyl prescribed and sent to pharmacy Follow up with OB/GYN and PCP for further workup outpatient No ovarian cyst seen today.   An After Visit Summary was printed and given to the patient.     Medication List    TAKE these medications   docusate sodium 100 MG capsule Commonly known as:  COLACE Take 1 capsule (100 mg total) by mouth 2 (two) times daily.   ibuprofen 800 MG tablet Commonly known as:  ADVIL,MOTRIN Take 1 tablet (800 mg total) by mouth every 8 (eight) hours as needed.   metroNIDAZOLE 500 MG tablet Commonly known as:  FLAGYL Take 1 tablet (500 mg total) by mouth 2 (two) times daily.   polyethylene glycol packet Commonly known as:  MIRALAX Take 17 g by mouth 2 (two) times daily. Take until becomes regular with BMs for 2 days, then slowly wean off to daily then stop.

## 2016-02-02 NOTE — Discharge Instructions (Signed)
Constipation, Adult Constipation is when a person:  Poops (has a bowel movement) fewer times in a week than normal.  Has a hard time pooping.  Has poop that is dry, hard, or bigger than normal. Follow these instructions at home: Eating and drinking  Eat foods that have a lot of fiber, such as:  Fresh fruits and vegetables.  Whole grains.  Beans.  Eat less of foods that are high in fat, low in fiber, or overly processed, such as:  [place] fries.  Hamburgers.  Cookies.  Candy.  Soda.  Drink enough fluid to keep your pee (urine) clear or pale yellow. General instructions  Exercise regularly or as told by your doctor.  Go to the restroom when you feel like you need to poop. Do not hold it in.  Take over-the-counter and prescription medicines only as told by your doctor. These include any fiber supplements.  Do pelvic floor retraining exercises, such as:  Doing deep breathing while relaxing your lower belly (abdomen).  Relaxing your pelvic floor while pooping.  Watch your condition for any changes.  Keep all follow-up visits as told by your doctor. This is important. Contact a doctor if:  You have pain that gets worse.  You have a fever.  You have not pooped for 4 days.  You throw up (vomit).  You are not hungry.  You lose weight.  You are bleeding from the anus.  You have thin, pencil-like poop (stool). Get help right away if:  You have a fever, and your symptoms suddenly get worse.  You leak poop or have blood in your poop.  Your belly feels hard or bigger than normal (is bloated).  You have very bad belly pain.  You feel dizzy or you faint. This information is not intended to replace advice given to you by your health care provider. Make sure you discuss any questions you have with your health care provider. Document Released: 08/21/2007 Document Revised: 09/22/2015 Document Reviewed: 08/23/2015 Elsevier Interactive Patient Education  2017  Elsevier Inc.   High-Fiber Diet Fiber, also called dietary fiber, is a type of carbohydrate found in fruits, vegetables, whole grains, and beans. A high-fiber diet can have many health benefits. Your health care provider may recommend a high-fiber diet to help:  Prevent constipation. Fiber can make your bowel movements more regular.  Lower your cholesterol.  Relieve hemorrhoids, uncomplicated diverticulosis, or irritable bowel syndrome.  Prevent overeating as part of a weight-loss plan.  Prevent heart disease, type 2 diabetes, and certain cancers. What is my plan? The recommended daily intake of fiber includes:  38 grams for men under age 17.  30 grams for men over age 77.  25 grams for women under age 96.  21 grams for women over age 45. You can get the recommended daily intake of dietary fiber by eating a variety of fruits, vegetables, grains, and beans. Your health care provider may also recommend a fiber supplement if it is not possible to get enough fiber through your diet. What do I need to know about a high-fiber diet?  Fiber supplements have not been widely studied for their effectiveness, so it is better to get fiber through food sources.  Always check the fiber content on thenutrition facts label of any prepackaged food. Look for foods that contain at least 5 grams of fiber per serving.  Ask your dietitian if you have questions about specific foods that are related to your condition, especially if those foods are not listed  in the following section.  Increase your daily fiber consumption gradually. Increasing your intake of dietary fiber too quickly may cause bloating, cramping, or gas.  Drink plenty of water. Water helps you to digest fiber. What foods can I eat? Grains  Whole-grain breads. Multigrain cereal. Oats and oatmeal. Brown rice. Barley. Bulgur wheat. Millet. Bran muffins. Popcorn. Rye wafer crackers. Vegetables  Sweet potatoes. Spinach. Kale. Artichokes.  Cabbage. Broccoli. Green peas. Carrots. Squash. Fruits  Berries. Pears. Apples. Oranges. Avocados. Prunes and raisins. Dried figs. Meats and Other Protein Sources  [place], kidney, pinto, and soy beans. Split peas. Lentils. Nuts and seeds. Dairy  Fiber-fortified yogurt. Beverages  Fiber-fortified soy milk. Fiber-fortified orange juice. Other  Fiber bars. The items listed above may not be a complete list of recommended foods or beverages. Contact your dietitian for more options.  What foods are not recommended? Grains  White bread. Pasta made with refined flour. White rice. Vegetables  Fried potatoes. Canned vegetables. Well-cooked vegetables. Fruits  Fruit juice. Cooked, strained fruit. Meats and Other Protein Sources  Fatty cuts of meat. Fried Environmental education officer or fried fish. Dairy  Milk. Yogurt. Cream cheese. Sour cream. Beverages  Soft drinks. Other  Cakes and pastries. Butter and oils. The items listed above may not be a complete list of foods and beverages to avoid. Contact your dietitian for more information.  What are some tips for including high-fiber foods in my diet?  Eat a wide variety of high-fiber foods.  Make sure that half of all grains consumed each day are whole grains.  Replace breads and cereals made from refined flour or white flour with whole-grain breads and cereals.  Replace white rice with brown rice, bulgur wheat, or millet.  Start the day with a breakfast that is high in fiber, such as a cereal that contains at least 5 grams of fiber per serving.  Use beans in place of meat in soups, salads, or pasta.  Eat high-fiber snacks, such as berries, raw vegetables, nuts, or popcorn. This information is not intended to replace advice given to you by your health care provider. Make sure you discuss any questions you have with your health care provider. Document Released: 03/04/2005 Document Revised: 08/10/2015 Document Reviewed: 08/17/2013 Elsevier Interactive Patient  Education  2017 Elsevier Inc.   Abdominal Pain, Adult Many things can cause belly (abdominal) pain. Most times, belly pain is not dangerous. Many cases of belly pain can be watched and treated at home. Sometimes belly pain is serious, though. Your doctor will try to find the cause of your belly pain. Follow these instructions at home:  Take over-the-counter and prescription medicines only as told by your doctor. Do not take medicines that help you poop (laxatives) unless told to by your doctor.  Drink enough fluid to keep your pee (urine) clear or pale yellow.  Watch your belly pain for any changes.  Keep all follow-up visits as told by your doctor. This is important. Contact a doctor if:  Your belly pain changes or gets worse.  You are not hungry, or you lose weight without trying.  You are having trouble pooping (constipated) or have watery poop (diarrhea) for more than 2-3 days.  You have pain when you pee or poop.  Your belly pain wakes you up at night.  Your pain gets worse with meals, after eating, or with certain foods.  You are throwing up and cannot keep anything down.  You have a fever. Get help right away if:  Your pain  does not go away as soon as your doctor says it should.  You cannot stop throwing up.  Your pain is only in areas of your belly, such as the right side or the left lower part of the belly.  You have bloody or black poop, or poop that looks like tar.  You have very bad pain, cramping, or bloating in your belly.  You have signs of not having enough fluid or water in your body (dehydration), such as:  Dark pee, very little pee, or no pee.  Cracked lips.  Dry mouth.  Sunken eyes.  Sleepiness.  Weakness. This information is not intended to replace advice given to you by your health care provider. Make sure you discuss any questions you have with your health care provider. Document Released: 08/21/2007 Document Revised: 09/22/2015  Document Reviewed: 08/16/2015 Elsevier Interactive Patient Education  2017 Elsevier Inc.   Bacterial Vaginosis Bacterial vaginosis is an infection of the vagina. It happens when too many germs (bacteria) grow in the vagina. This infection puts you at risk for infections from sex (STIs). Treating this infection can lower your risk for some STIs. You should also treat this if you are pregnant. It can cause your baby to be born early. Follow these instructions at home: Medicines  Take over-the-counter and prescription medicines only as told by your doctor.  Take or use your antibiotic medicine as told by your doctor. Do not stop taking or using it even if you start to feel better. General instructions  If you your sexual partner is a woman, tell her that you have this infection. She needs to get treatment if she has symptoms. If you have a female partner, he does not need to be treated.  During treatment:  Avoid sex.  Do not douche.  Avoid alcohol as told.  Avoid breastfeeding as told.  Drink enough fluid to keep your pee (urine) clear or pale yellow.  Keep your vagina and butt (rectum) clean.  Wash the area with warm water every day.  Wipe from front to back after you use the toilet.  Keep all follow-up visits as told by your doctor. This is important. Preventing this condition  Do not douche.  Use only warm water to wash around your vagina.  Use protection when you have sex. This includes:  Latex condoms.  Dental dams.  Limit how many people you have sex with. It is best to only have sex with the same person (be monogamous).  Get tested for STIs. Have your partner get tested.  Wear underwear that is cotton or lined with cotton.  Avoid tight pants and pantyhose. This is most important in summer.  Do not use any products that have nicotine or tobacco in them. These include cigarettes and e-cigarettes. If you need help quitting, ask your doctor.  Do not use illegal  drugs.  Limit how much alcohol you drink. Contact a doctor if:  Your symptoms do not get better, even after you are treated.  You have more discharge or pain when you pee (urinate).  You have a fever.  You have pain in your belly (abdomen).  You have pain with sex.  Your bleed from your vagina between periods. Summary  This infection happens when too many germs (bacteria) grow in the vagina.  Treating this condition can lower your risk for some infections from sex (STIs).  You should also treat this if you are pregnant. It can cause early (premature) birth.  Do not stop  taking or using your antibiotic medicine even if you start to feel better. This information is not intended to replace advice given to you by your health care provider. Make sure you discuss any questions you have with your health care provider. Document Released: 12/12/2007 Document Revised: 11/18/2015 Document Reviewed: 11/18/2015 Elsevier Interactive Patient Education  2017 [employer]

## 2016-02-05 LAB — GC/CHLAMYDIA PROBE AMP (~~LOC~~) NOT AT ARMC
Chlamydia: NEGATIVE
Neisseria Gonorrhea: NEGATIVE

## 2016-02-06 ENCOUNTER — Emergency Department (HOSPITAL_COMMUNITY)
Admission: EM | Admit: 2016-02-06 | Discharge: 2016-02-06 | Disposition: A | Discharge status: Home / Self Care | Payer: Self-pay | Attending: Emergency Medicine | Admitting: Emergency Medicine

## 2016-02-06 ENCOUNTER — Encounter (HOSPITAL_COMMUNITY): Payer: Self-pay

## 2016-02-06 DIAGNOSIS — F17200 Nicotine dependence, unspecified, uncomplicated: Secondary | ICD-10-CM | POA: Insufficient documentation

## 2016-02-06 DIAGNOSIS — J45909 Unspecified asthma, uncomplicated: Secondary | ICD-10-CM | POA: Insufficient documentation

## 2016-02-06 DIAGNOSIS — K0889 Other specified disorders of teeth and supporting structures: Secondary | ICD-10-CM | POA: Insufficient documentation

## 2016-02-06 MED ORDER — HYDROCODONE-ACETAMINOPHEN 5-325 MG PO TABS: 1.0000 | ORAL_TABLET | Freq: Four times a day (QID) | ORAL | 0 refills | Status: DC | PRN

## 2016-02-06 MED ORDER — CLINDAMYCIN HCL 150 MG PO CAPS: 150.0000 mg | ORAL_CAPSULE | Freq: Three times a day (TID) | ORAL | 0 refills | Status: DC

## 2016-02-06 NOTE — ED Provider Notes (Signed)
[facility]-EMERGENCY DEPT Provider Note   CSN: [id number] Arrival date & time: 02/06/16  1144  By signing my name below, I, Soijett Blue, attest that this documentation has been prepared under the direction and in the presence of Buel ReamAlexandra Charli Halle, PA-C Electronically Signed: Soijett Blue, ED Scribe. 02/06/16. 12:04 PM.   History   Chief Complaint Chief Complaint  Patient presents with  . Dental Pain    HPI Michelle Lucas is a 25 y.o. female who presents to the Emergency Department complaining of right upper back dental pain onset 2 months ago. Pt notes that her right upper back tooth is broken and she went to the dentist this morning and was advised that she would need to be on abx prior to having a dental extraction. Pt reports that she is unsure of when her tooth became broken. She states that she has not tried any medications for the relief for her symptoms. She denies CP, SOB, abdominal pain, nausea, vomiting, trouble swallowing, dysuria, facial swelling, and any other symptoms. Pt notes that she is allergic to penicillin and it causes hives.    The history is provided by the patient. No language interpreter was used.    Past Medical History:  Diagnosis Date  . Anxiety attack   . Asthma   . Pseudoseizures     There are no active problems to display for this patient.   History reviewed. No pertinent surgical history.  OB History    Gravida Para Term Preterm AB Living   0 0 0 0 0 0   SAB TAB Ectopic Multiple Live Births   0 0 0 0 0       Home Medications    Prior to Admission medications   Medication Sig Start Date End Date Taking? Authorizing Provider  clindamycin (CLEOCIN) 150 MG capsule Take 1 capsule (150 mg total) by mouth 3 (three) times daily. 02/06/16   Emi HolesAlexandra M Glendal Cassaday, PA-C  docusate sodium (COLACE) 100 MG capsule Take 1 capsule (100 mg total) by mouth 2 (two) times daily. 02/02/16   Hiram ComberElizabeth Woodland Mumaw, DO  HYDROcodone-acetaminophen (NORCO/VICODIN) 5-325 MG  tablet Take 1-2 tablets by mouth every 6 (six) hours as needed for severe pain. 02/06/16   Emi HolesAlexandra M Malajah Oceguera, PA-C  ibuprofen (ADVIL,MOTRIN) 800 MG tablet Take 1 tablet (800 mg total) by mouth every 8 (eight) hours as needed. Patient not taking: Reported on 11/06/2015 08/17/15   Charmayne Sheerharles M Beers, PA-C  metroNIDAZOLE (FLAGYL) 500 MG tablet Take 1 tablet (500 mg total) by mouth 2 (two) times daily. 02/02/16   Hiram ComberElizabeth Woodland Mumaw, DO  polyethylene glycol [facility](MIRALAX) packet Take 17 g by mouth 2 (two) times daily. Take until becomes regular with BMs for 2 days, then slowly wean off to daily then stop. 02/02/16   Michaele OfferElizabeth Woodland Mumaw, DO    Family History No family history on file.  Social History Social History  Substance Use Topics  . Smoking status: Current Some Day Smoker  . Smokeless tobacco: Never Used  . Alcohol use Yes     Allergies   Penicillins   Review of Systems Review of Systems  Constitutional: Negative for chills and fever.  HENT: Positive for dental problem (right upper back). Negative for facial swelling, sore throat and trouble swallowing.   Respiratory: Negative for shortness of breath.   Cardiovascular: Negative for chest pain.  Gastrointestinal: Negative for abdominal pain, nausea and vomiting.  Genitourinary: Negative for dysuria.  Neurological: Negative for headaches.  Psychiatric/Behavioral: The patient is  not nervous/anxious.      Physical Exam Updated Vital Signs BP (!) 118/52 (BP Location: Left Arm)   Pulse 88   Temp 98 F (36.7 C) (Oral)   Resp 18   Ht 5' 5.5" (1.664 m)   Wt 195 lb (88.5 kg)   LMP 01/27/2016   SpO2 100%   BMI 31.96 kg/m   Physical Exam  Constitutional: She appears well-developed and well-nourished. No distress.  HENT:  Head: Normocephalic and atraumatic.    Mouth/Throat: Oropharynx is clear and moist. No oropharyngeal exudate.    No fluctuance or abscess noted; chronically fractured tooth  Eyes: Conjunctivae are  normal. Pupils are equal, round, and reactive to light. Right eye exhibits no discharge. Left eye exhibits no discharge. No scleral icterus.  Neck: Normal range of motion. Neck supple. No thyromegaly present.  Cardiovascular: Normal rate, regular rhythm, normal heart sounds and intact distal pulses.  Exam reveals no gallop and no friction rub.   No murmur heard. Pulmonary/Chest: Effort normal and breath sounds normal. No stridor. No respiratory distress. She has no wheezes. She has no rales.  Abdominal: Soft. Bowel sounds are normal. She exhibits no distension. There is no tenderness. There is no rebound and no guarding.  Musculoskeletal: She exhibits no edema.  Lymphadenopathy:    She has no cervical adenopathy.  Neurological: She is alert. Coordination normal.  Skin: Skin is warm and dry. No rash noted. She is not diaphoretic. No pallor.  Psychiatric: She has a normal mood and affect.  Nursing note and vitals reviewed.    ED Treatments / Results  DIAGNOSTIC STUDIES: Oxygen Saturation is 100% on RA, nl by my interpretation.    COORDINATION OF CARE:    Labs (all labs ordered are listed, but only abnormal results are displayed) Labs Reviewed - No data to display  EKG  EKG Interpretation None       Radiology No results found.  Procedures Procedures (including critical care time)  Medications Ordered in ED Medications - No data to display   Initial Impression / Assessment and Plan / ED Course  I have reviewed the triage vital signs and the nursing notes.  Pertinent labs & imaging results that were available during my care of the patient were reviewed by me and considered in my medical decision making (see chart for details).  Clinical Course      Patient with dentalgia.  No abscess requiring immediate incision and drainage.  Exam not concerning for Ludwig's angina or pharyngeal abscess.  Will treat with Clindamycin and Norco. She advised to take probiotics with this  medication. Pt instructed to follow-up with dentist.  Discussed return precautions. Pt safe for discharge. Patient also evaluated by Dr. Hyacinth MeekerMiller who guided the patient's management and agrees with plan.  Final Clinical Impressions(s) / ED Diagnoses   Final diagnoses:  Pain, dental    New Prescriptions New Prescriptions   CLINDAMYCIN (CLEOCIN) 150 MG CAPSULE    Take 1 capsule (150 mg total) by mouth 3 (three) times daily.   HYDROCODONE-ACETAMINOPHEN (NORCO/VICODIN) 5-325 MG TABLET    Take 1-2 tablets by mouth every 6 (six) hours as needed for severe pain.   I personally performed the services described in this documentation, which was scribed in my presence. The recorded information has been reviewed and is accurate.     Emi Holeslexandra M Shadie Sweatman, PA-C 02/06/16 1306    Eber HongBrian Miller, MD 02/08/16 1118

## 2016-02-06 NOTE — ED Triage Notes (Signed)
Per Pt, pt had wisdom teeth pulled about a year ago. About two months ago, pt reports dental pain and sensitivity noted to the right upper teeth. Denies fever

## 2016-02-06 NOTE — ED Notes (Signed)
[facility] COUPON 239 GIVEN.

## 2016-02-06 NOTE — ED Notes (Signed)
C/o right upper dental pain x 2 months.

## 2016-02-06 NOTE — Discharge Instructions (Signed)
Medications: Clindamycin, Norco  Treatment: Take clindamycin 3 times daily. You can take over-the-counter probiotics to help with stomach upset with this medicine. Make sure to finish all of the antibiotic. You can take 1-2 Norco every 6 hours as needed for severe pain. He can take Motrin as prescribed over-the-counter for mild to moderate pain.  Follow-up: Please follow-up with your dentist as soon as possible for follow-up and further evaluation. Please return to emergency department if you develop any new or worsening symptoms.

## 2016-07-31 ENCOUNTER — Emergency Department (HOSPITAL_COMMUNITY): Payer: Self-pay

## 2016-07-31 ENCOUNTER — Encounter (HOSPITAL_COMMUNITY): Payer: Self-pay | Admitting: Emergency Medicine

## 2016-07-31 ENCOUNTER — Emergency Department (HOSPITAL_COMMUNITY)
Admission: EM | Admit: 2016-07-31 | Discharge: 2016-08-01 | Disposition: A | Discharge status: Home / Self Care | Payer: Self-pay | Attending: Emergency Medicine | Admitting: Emergency Medicine

## 2016-07-31 DIAGNOSIS — Y999 Unspecified external cause status: Secondary | ICD-10-CM | POA: Insufficient documentation

## 2016-07-31 DIAGNOSIS — J45909 Unspecified asthma, uncomplicated: Secondary | ICD-10-CM | POA: Insufficient documentation

## 2016-07-31 DIAGNOSIS — Y939 Activity, unspecified: Secondary | ICD-10-CM | POA: Insufficient documentation

## 2016-07-31 DIAGNOSIS — F17200 Nicotine dependence, unspecified, uncomplicated: Secondary | ICD-10-CM | POA: Insufficient documentation

## 2016-07-31 DIAGNOSIS — S93402A Sprain of unspecified ligament of left ankle, initial encounter: Secondary | ICD-10-CM | POA: Insufficient documentation

## 2016-07-31 DIAGNOSIS — Y929 Unspecified place or not applicable: Secondary | ICD-10-CM | POA: Insufficient documentation

## 2016-07-31 DIAGNOSIS — W010XXA Fall on same level from slipping, tripping and stumbling without subsequent striking against object, initial encounter: Secondary | ICD-10-CM | POA: Insufficient documentation

## 2016-07-31 DIAGNOSIS — Z79899 Other long term (current) drug therapy: Secondary | ICD-10-CM | POA: Insufficient documentation

## 2016-07-31 LAB — POC URINE PREG, ED: Preg Test, Ur: NEGATIVE

## 2016-07-31 MED ORDER — ACETAMINOPHEN 500 MG PO TABS
1000.0000 mg | ORAL_TABLET | Freq: Once | ORAL | Status: AC
  Administered 2016-08-01: 1000 mg via ORAL
  Filled 2016-07-31: qty 2

## 2016-07-31 MED ORDER — KETOROLAC TROMETHAMINE 60 MG/2ML IM SOLN
30.0000 mg | Freq: Once | INTRAMUSCULAR | Status: AC
  Administered 2016-08-01: 30 mg via INTRAMUSCULAR
  Filled 2016-07-31: qty 2

## 2016-07-31 NOTE — ED Triage Notes (Signed)
Pt fell over dog and down 7 steps causing deformity to left foot and ankle. Denies LOC or other injuries reported

## 2016-07-31 NOTE — ED Notes (Signed)
Bed: WA20 Expected date:  Expected time:  Means of arrival:  Comments: EMS 26 yo female tripped and fell down 4 steps-left ankle swelling

## 2016-07-31 NOTE — ED Provider Notes (Signed)
[facility]-EMERGENCY DEPT Provider Note   CSN: [id number] Arrival date & time: 07/31/16  2314  By signing my name below, I, Elder NegusRussell Johnston, attest that this documentation has been prepared under the direction and in the presence of Shanasia Ibrahim, MD. Electronically Signed: Elder Negusussell Johnston, Scribe. 07/31/16. 11:32 PM.   History   Chief Complaint Chief Complaint  Patient presents with  . Fall    down 4 steps @ 2000 today c/o left ankle/foot pain    HPI Michelle Lucas is a 26 y.o. female with history of asthma who presents to the ED following a fall. This patient states that she tripped over her dog 3 hours ago. Fell down 4 stairs. She did not strike her head or lose consciousness. At interview, she is reporting severe pain and swelling to the L foot and ankle as well as decreased sensation to the L toes. She has not attempted any pain therapy at home. LMP was 5/12.  The history is provided by the patient. No language interpreter was used.  Fall  This is a new problem. The current episode started 3 to 5 hours ago. The problem occurs constantly. The problem has not changed since onset.Pertinent negatives include no chest pain, no abdominal pain and no shortness of breath. Associated symptoms comments: L ankle pain. Nothing aggravates the symptoms. Nothing relieves the symptoms. She has tried nothing for the symptoms. The treatment provided no relief.    Past Medical History:  Diagnosis Date  . Anxiety attack   . Asthma   . Pseudoseizures     There are no active problems to display for this patient.   History reviewed. No pertinent surgical history.  OB History    Gravida Para Term Preterm AB Living   0 0 0 0 0 0   SAB TAB Ectopic Multiple Live Births   0 0 0 0 0       Home Medications    Prior to Admission medications   Medication Sig Start Date End Date Taking? Authorizing Provider  clindamycin (CLEOCIN) 150 MG capsule Take 1 capsule (150 mg total) by mouth 3 (three) times  daily. 02/06/16   Law, Waylan BogaAlexandra M, PA-C  docusate sodium (COLACE) 100 MG capsule Take 1 capsule (100 mg total) by mouth 2 (two) times daily. 02/02/16   Mumaw, Hiram ComberElizabeth Woodland, DO  HYDROcodone-acetaminophen (NORCO/VICODIN) 5-325 MG tablet Take 1-2 tablets by mouth every 6 (six) hours as needed for severe pain. 02/06/16   Law, Waylan BogaAlexandra M, PA-C  ibuprofen (ADVIL,MOTRIN) 800 MG tablet Take 1 tablet (800 mg total) by mouth every 8 (eight) hours as needed. Patient not taking: Reported on 11/06/2015 08/17/15   Evangeline DakinBeers, Charles M, PA-C  metroNIDAZOLE (FLAGYL) 500 MG tablet Take 1 tablet (500 mg total) by mouth 2 (two) times daily. 02/02/16   Mumaw, Hiram ComberElizabeth Woodland, DO  polyethylene glycol [facility](MIRALAX) packet Take 17 g by mouth 2 (two) times daily. Take until becomes regular with BMs for 2 days, then slowly wean off to daily then stop. 02/02/16   Mumaw, Hiram ComberElizabeth Woodland, DO    Family History History reviewed. No pertinent family history.  Social History Social History  Substance Use Topics  . Smoking status: Current Some Day Smoker  . Smokeless tobacco: Never Used  . Alcohol use Yes     Allergies   Penicillins   Review of Systems Review of Systems  Constitutional: Negative for chills and fever.  HENT: Negative for ear pain and sore throat.   Eyes: Negative for pain  and visual disturbance.  Respiratory: Negative for cough and shortness of breath.   Cardiovascular: Negative for chest pain and palpitations.  Gastrointestinal: Negative for abdominal pain and vomiting.  Genitourinary: Negative for dysuria and hematuria.  Musculoskeletal: Positive for arthralgias. Negative for back pain.       L ankle pain/swelling  Skin: Negative for color change and rash.  Neurological: Negative for seizures and syncope.  All other systems reviewed and are negative.    Physical Exam Updated Vital Signs BP (!) 134/95 (BP Location: Left Arm)   Pulse 81   Temp 98.3 F (36.8 C) (Oral)   Resp 18   Ht  5' 5.5" (1.664 m)   Wt 200 lb (90.7 kg)   LMP 07/27/2016   SpO2 99%   BMI 32.78 kg/m   Physical Exam  Constitutional: She appears well-developed and well-nourished. No distress.  HENT:  Head: Normocephalic and atraumatic.  Nose: Nose normal.  Eyes: Conjunctivae and EOM are normal.  Neck: Normal range of motion. Neck supple.  Cardiovascular: Normal rate, regular rhythm, normal heart sounds and intact distal pulses.   No murmur heard. 2+ DP and PT pulses bilaterally.  Pulmonary/Chest: Effort normal and breath sounds normal. No respiratory distress. She has no wheezes. She has no rales.  Abdominal: Soft. She exhibits no mass. There is no tenderness. There is no guarding.  Musculoskeletal: She exhibits no edema.       Left ankle: She exhibits normal range of motion, no ecchymosis, no laceration and normal pulse. Achilles tendon normal.       Left lower leg: Normal.       Left foot: Normal. There is normal range of motion, no tenderness, no bony tenderness, no swelling, normal capillary refill, no crepitus, no deformity and no laceration.  Pain and swelling to the L ankle.  Neurological: She is alert.  Reporting subjective decreased sensation in the L toes. Able to move the L toes.  Skin: Skin is warm and dry.  Psychiatric: She has a normal mood and affect.  Nursing note and vitals reviewed.    ED Treatments / Results   Vitals:   07/31/16 2319  BP: (!) 134/95  Pulse: 81  Resp: 18  Temp: 98.3 F (36.8 C)    Radiology No results found.  Procedures Procedures (including critical care time)  Medications Ordered in ED  Medications  ketorolac (TORADOL) injection 30 mg (not administered)  acetaminophen (TYLENOL) tablet 1,000 mg (not administered)      Final Clinical Impressions(s) / ED Diagnoses  Ankle injury:  Packing removal in 48 hours at urgent care. The patient is nontoxic-appearing on exam and vital signs are within normal limits.   I have reviewed the triage  vital signs and the nursing notes. Pertinent labs &imaging results that were available during my care of the patient were reviewed by me and considered in my medical decision making (see chart for details). The patient is nontoxic-appearing on exam and vital signs are within normal limits. Return for weakness, numbness, change in skin color, pulseless, pain with exertion or any concerns. Ice and elevate ankle when not on it  After history, exam, and medical workup I feel the patient has been appropriately medically screened and is safe for discharge home. Pertinent diagnoses were discussed with the patient. Patient was given return precautions.   I personally performed the services described in this documentation, which was scribed in my presence. The recorded information has been reviewed and is accurate.  Ceniyah Thorp, MD 08/01/16 [id number]

## 2016-08-01 MED ORDER — NAPROXEN 375 MG PO TABS: 375.0000 mg | ORAL_TABLET | Freq: Two times a day (BID) | ORAL | 0 refills | Status: DC

## 2016-08-01 NOTE — ED Notes (Signed)
Patient wheeled out to lobby.

## 2016-09-27 ENCOUNTER — Emergency Department (HOSPITAL_COMMUNITY)
Admission: EM | Admit: 2016-09-27 | Discharge: 2016-09-27 | Disposition: A | Discharge status: Home / Self Care | Payer: Self-pay | Attending: Emergency Medicine | Admitting: Emergency Medicine

## 2016-09-27 ENCOUNTER — Encounter (HOSPITAL_COMMUNITY): Payer: Self-pay | Admitting: *Deleted

## 2016-09-27 DIAGNOSIS — A5901 Trichomonal vulvovaginitis: Secondary | ICD-10-CM | POA: Insufficient documentation

## 2016-09-27 DIAGNOSIS — A599 Trichomoniasis, unspecified: Secondary | ICD-10-CM

## 2016-09-27 DIAGNOSIS — B9689 Other specified bacterial agents as the cause of diseases classified elsewhere: Secondary | ICD-10-CM

## 2016-09-27 DIAGNOSIS — J45909 Unspecified asthma, uncomplicated: Secondary | ICD-10-CM | POA: Insufficient documentation

## 2016-09-27 DIAGNOSIS — R1030 Lower abdominal pain, unspecified: Secondary | ICD-10-CM

## 2016-09-27 DIAGNOSIS — N760 Acute vaginitis: Secondary | ICD-10-CM | POA: Insufficient documentation

## 2016-09-27 DIAGNOSIS — Z87891 Personal history of nicotine dependence: Secondary | ICD-10-CM | POA: Insufficient documentation

## 2016-09-27 LAB — CBC WITH DIFFERENTIAL/PLATELET
Basophils Absolute: 0 10*3/uL (ref 0.0–0.1)
Basophils Relative: 1 %
Eosinophils Absolute: 0.1 10*3/uL (ref 0.0–0.7)
Eosinophils Relative: 1 %
HCT: 36.1 % (ref 36.0–46.0)
Hemoglobin: 11.6 g/dL — ABNORMAL LOW (ref 12.0–15.0)
Lymphocytes Relative: 38 %
Lymphs Abs: 2.4 10*3/uL (ref 0.7–4.0)
MCH: 27.7 pg (ref 26.0–34.0)
MCHC: 32.1 g/dL (ref 30.0–36.0)
MCV: 86.2 fL (ref 78.0–100.0)
Monocytes Absolute: 0.6 10*3/uL (ref 0.1–1.0)
Monocytes Relative: 9 %
Neutro Abs: 3.3 10*3/uL (ref 1.7–7.7)
Neutrophils Relative %: 51 %
Platelets: 292 10*3/uL (ref 150–400)
RBC: 4.19 MIL/uL (ref 3.87–5.11)
RDW: 13.8 % (ref 11.5–15.5)
WBC: 6.4 10*3/uL (ref 4.0–10.5)

## 2016-09-27 LAB — BASIC METABOLIC PANEL
Anion gap: 9 (ref 5–15)
BUN: 6 mg/dL (ref 6–20)
CO2: 24 mmol/L (ref 22–32)
Calcium: 9.1 mg/dL (ref 8.9–10.3)
Chloride: 106 mmol/L (ref 101–111)
Creatinine, Ser: 0.76 mg/dL (ref 0.44–1.00)
GFR calc Af Amer: >60 mL/min (ref >60)
GFR calc non Af Amer: >60 mL/min (ref >60)
Glucose, Bld: 76 mg/dL (ref 65–99)
Potassium: 3.7 mmol/L (ref 3.5–5.1)
Sodium: 139 mmol/L (ref 135–145)

## 2016-09-27 LAB — URINALYSIS, ROUTINE W REFLEX MICROSCOPIC
Bilirubin Urine: NEGATIVE
Glucose, UA: NEGATIVE mg/dL
Hgb urine dipstick: NEGATIVE
Ketones, ur: 5 mg/dL — AB
Leukocytes, UA: NEGATIVE
Nitrite: NEGATIVE
Protein, ur: NEGATIVE mg/dL
Specific Gravity, Urine: 1.005 (ref 1.005–1.030)
pH: 5 (ref 5.0–8.0)

## 2016-09-27 LAB — WET PREP, GENITAL
Sperm: NONE SEEN
Yeast Wet Prep HPF POC: NONE SEEN

## 2016-09-27 LAB — I-STAT BETA HCG BLOOD, ED (MC, WL, AP ONLY): I-stat hCG, quantitative: <5 m[IU]/mL

## 2016-09-27 MED ORDER — AZITHROMYCIN 250 MG PO TABS
1000.0000 mg | ORAL_TABLET | Freq: Once | ORAL | Status: AC
  Administered 2016-09-27: 1000 mg via ORAL
  Filled 2016-09-27: qty 4

## 2016-09-27 MED ORDER — LIDOCAINE HCL (PF) 1 % IJ SOLN
INTRAMUSCULAR | Status: AC
  Administered 2016-09-27: 5 mL
  Filled 2016-09-27: qty 5

## 2016-09-27 MED ORDER — METRONIDAZOLE 500 MG PO TABS: 500.0000 mg | ORAL_TABLET | Freq: Two times a day (BID) | ORAL | 0 refills | Status: DC

## 2016-09-27 MED ORDER — METRONIDAZOLE 500 MG PO TABS
500.0000 mg | ORAL_TABLET | Freq: Once | ORAL | Status: AC
  Administered 2016-09-27: 500 mg via ORAL
  Filled 2016-09-27: qty 1

## 2016-09-27 MED ORDER — ONDANSETRON 4 MG PO TBDP
4.0000 mg | ORAL_TABLET | Freq: Once | ORAL | Status: AC | PRN
  Administered 2016-09-27: 4 mg via ORAL
  Filled 2016-09-27: qty 1

## 2016-09-27 MED ORDER — CEFTRIAXONE SODIUM 250 MG IJ SOLR
250.0000 mg | Freq: Once | INTRAMUSCULAR | Status: AC
  Administered 2016-09-27: 250 mg via INTRAMUSCULAR
  Filled 2016-09-27: qty 250

## 2016-09-27 MED ORDER — ACETAMINOPHEN 325 MG PO TABS
650.0000 mg | ORAL_TABLET | Freq: Once | ORAL | Status: AC
  Administered 2016-09-27: 650 mg via ORAL
  Filled 2016-09-27: qty 2

## 2016-09-27 NOTE — ED Triage Notes (Signed)
Pt c/o mid lower abd pain onset June 1st with clear vaginal discharge, pt states, "II wanted to see if I have BV." pt denies v/d, pt c/o  Nausea, A&o x4

## 2016-09-27 NOTE — ED Provider Notes (Signed)
[facility]-EMERGENCY DEPT Provider Note   CSN: [id number] Arrival date & time: 09/27/16  1341     History   Chief Complaint Chief Complaint  Patient presents with  . Abdominal Pain    HPI Michelle Lucas is a 26 y.o. female who is presenting with suprapubic abdominal pain isn't clear vaginal discharge since June 1. She states it is intermittent. Has some occasional associated nausea and vomiting. No hematuria or dysuria. Unsure of LMP. Has only tried ibuprofen earlier today and she said it did not help. No chest pain, shortness of breath. No diarrhea/constipation.  The history is provided by the patient.  Illness  This is a new problem. The current episode started more than 1 week ago. The problem occurs every several days. The problem has not changed since onset.Associated symptoms include abdominal pain. Pertinent negatives include no chest pain, no headaches and no shortness of breath. Associated symptoms comments: Vaginal discharge, mid suprapubic abdominal pain.. Exacerbated by: palpation. Nothing relieves the symptoms. She has tried nothing for the symptoms. The treatment provided no relief.    Past Medical History:  Diagnosis Date  . Anxiety attack   . Asthma   . Pseudoseizures     There are no active problems to display for this patient.   History reviewed. No pertinent surgical history.  OB History    Gravida Para Term Preterm AB Living   0 0 0 0 0 0   SAB TAB Ectopic Multiple Live Births   0 0 0 0 0       Home Medications    Prior to Admission medications   Medication Sig Start Date End Date Taking? Authorizing Provider  ibuprofen (ADVIL,MOTRIN) 200 MG tablet Take 200-400 mg by mouth every 6 (six) hours as needed (for pain).   Yes [provider]  metroNIDAZOLE (FLAGYL) 500 MG tablet Take 1 tablet (500 mg total) by mouth 2 (two) times daily. 09/27/16   Orson Slickolson, Kalei Mckillop, MD  naproxen (NAPROSYN) 375 MG tablet Take 1 tablet (375 mg total) by mouth 2 (two) times  daily. Patient not taking: Reported on 09/27/2016 08/01/16   Nicanor AlconPalumbo, April, MD    Family History No family history on file.  Social History Social History  Substance Use Topics  . Smoking status: Former Smoker    Types: Cigarettes    Quit date: 03/19/2015  . Smokeless tobacco: Never Used  . Alcohol use Yes     Allergies   Penicillins   Review of Systems Review of Systems  Constitutional: Negative for chills and fever.  HENT: Negative for ear pain and sore throat.   Eyes: Negative for pain and visual disturbance.  Respiratory: Negative for cough and shortness of breath.   Cardiovascular: Negative for chest pain and palpitations.  Gastrointestinal: Positive for abdominal pain, nausea and vomiting. Negative for constipation and diarrhea.  Genitourinary: Positive for vaginal discharge. Negative for decreased urine volume, difficulty urinating, dysuria, frequency, hematuria, menstrual problem, urgency, vaginal bleeding and vaginal pain.  Musculoskeletal: Negative for arthralgias and back pain.  Skin: Negative for color change and rash.  Neurological: Negative for seizures, syncope and headaches.  All other systems reviewed and are negative.    Physical Exam Updated Vital Signs BP 116/74 (BP Location: Right Arm)   Pulse 72   Resp 18   Ht 5' 5.5" (1.664 m)   Wt 90.7 kg (200 lb)   LMP 09/20/2016 (Approximate)   SpO2 99%   BMI 32.78 kg/m   Physical Exam  Constitutional: She  is oriented to person, place, and time. She appears well-developed and well-nourished. No distress.  HENT:  Head: Normocephalic and atraumatic.  Eyes: Pupils are equal, round, and reactive to light. Conjunctivae and EOM are normal.  Neck: Neck supple. No tracheal deviation present.  Cardiovascular: Normal rate and regular rhythm.   No murmur heard. Pulmonary/Chest: Effort normal and breath sounds normal. No respiratory distress.  Abdominal: Soft. There is no tenderness.  Genitourinary: Uterus  normal. Vaginal discharge (clear/white) found.  Genitourinary Comments: Mild right adnexal tenderness without obvious masses  Musculoskeletal: She exhibits no edema or deformity.  Neurological: She is alert and oriented to person, place, and time.  Skin: Skin is warm and dry.  Psychiatric: She has a normal mood and affect.  Nursing note and vitals reviewed.    ED Treatments / Results  Labs (all labs ordered are listed, but only abnormal results are displayed) Labs Reviewed  WET PREP, GENITAL - Abnormal; Notable for the following:       Result Value   Trich, Wet Prep PRESENT (*)    Clue Cells Wet Prep HPF POC PRESENT (*)    WBC, Wet Prep HPF POC MODERATE (*)    All other components within normal limits  URINALYSIS, ROUTINE W REFLEX MICROSCOPIC - Abnormal; Notable for the following:    Color, Urine STRAW (*)    Ketones, ur 5 (*)    All other components within normal limits  CBC WITH DIFFERENTIAL/PLATELET - Abnormal; Notable for the following:    Hemoglobin 11.6 (*)    All other components within normal limits  BASIC METABOLIC PANEL  I-STAT BETA HCG BLOOD, ED ([facility], [facility], [facility] ONLY)  GC/CHLAMYDIA PROBE AMP ([place]) NOT AT [facility]    EKG  EKG Interpretation None       Radiology No results found.  Procedures Procedures (including critical care time)  Medications Ordered in ED Medications  cefTRIAXone (ROCEPHIN) injection 250 mg (250 mg Intramuscular Given 09/27/16 1617)  azithromycin (ZITHROMAX) tablet 1,000 mg (1,000 mg Oral Given 09/27/16 1617)  acetaminophen (TYLENOL) tablet 650 mg (650 mg Oral Given 09/27/16 1617)  lidocaine (PF) (XYLOCAINE) 1 % injection (5 mLs  Given 09/27/16 1617)  metroNIDAZOLE (FLAGYL) tablet 500 mg (500 mg Oral Given 09/27/16 1644)  ondansetron (ZOFRAN-ODT) disintegrating tablet 4 mg (4 mg Oral Given 09/27/16 1711)     Initial Impression / Assessment and Plan / ED Course  I have reviewed the triage vital signs and the nursing notes.  Pertinent  labs & imaging results that were available during my care of the patient were reviewed by me and considered in my medical decision making (see chart for details).     Patient presented with lower middle abdominal pain since early June with clear vaginal discharge. Ordered wet prep and GC/chlamydia. Pelvic exam shows Clear white discharge and very mild right adnexal tenderness with no obvious masses or other abnormalities on pelvic exam. Through shared decision making, patient decided to be empirically treated for GC/chlamydia. Given 250 Rocephin, 1 g azithro. Patient is well-appearing with stable vital signs are within normal limits.  Lab work positive for Trichomonas and clue cells consistent with bacterial vaginosis. Given 500 mg metronidazole here and discharged with prescription for metronidazole. Instructed that she needed to have sexual partners tested and treated as well. Do not believe this is PID requiring admission. Do believe she is safe for discharge with outpatient management. Through shared decision making she was also comfortable going home with outpatient management and was stable  while under my care in the emergency department.  Patient was seen with my attending, Dr. Rosalia Hammers, who voiced agreement and oversaw the evaluation and treatment of this patient.   Dragon [employer] illustrator was used in the creation of this note. If there are any errors or inconsistencies needing clarification, please contact me directly.   Final Clinical Impressions(s) / ED Diagnoses   Final diagnoses:  BV (bacterial vaginosis)  Trichomonosis  Lower abdominal pain    New Prescriptions Discharge Medication List as of 09/27/2016  5:26 PM    START taking these medications   Details  metroNIDAZOLE (FLAGYL) 500 MG tablet Take 1 tablet (500 mg total) by mouth 2 (two) times daily., Starting Fri 09/27/2016, Print         Orson Slick, MD 09/28/16 [id number]    Margarita Grizzle, MD 09/29/16 [phone]

## 2016-09-27 NOTE — ED Notes (Addendum)
Patient reports feeling nauseated after receiving oral medications given here. Provided ginger ale and graham crackers and placed order for Zofran under standing order set. EDP aware.

## 2016-09-27 NOTE — ED Notes (Signed)
Patient verbalized understanding of discharge instructions and denies any further needs or questions at this time. VS stable. Patient ambulatory with steady gait.  

## 2016-09-30 LAB — GC/CHLAMYDIA PROBE AMP (~~LOC~~) NOT AT ARMC
Chlamydia: NEGATIVE
Neisseria Gonorrhea: NEGATIVE

## 2016-10-17 ENCOUNTER — Encounter (HOSPITAL_COMMUNITY): Payer: Self-pay | Admitting: Emergency Medicine

## 2016-10-17 ENCOUNTER — Emergency Department (HOSPITAL_COMMUNITY)
Admission: EM | Admit: 2016-10-17 | Discharge: 2016-10-18 | Disposition: A | Discharge status: Home / Self Care | Payer: Self-pay | Attending: Emergency Medicine | Admitting: Emergency Medicine

## 2016-10-17 DIAGNOSIS — R45850 Homicidal ideations: Secondary | ICD-10-CM | POA: Insufficient documentation

## 2016-10-17 DIAGNOSIS — Z87891 Personal history of nicotine dependence: Secondary | ICD-10-CM | POA: Insufficient documentation

## 2016-10-17 DIAGNOSIS — F4324 Adjustment disorder with disturbance of conduct: Secondary | ICD-10-CM | POA: Diagnosis present

## 2016-10-17 DIAGNOSIS — Z008 Encounter for other general examination: Secondary | ICD-10-CM

## 2016-10-17 LAB — COMPREHENSIVE METABOLIC PANEL
ALT: 14 U/L (ref 14–54)
AST: 18 U/L (ref 15–41)
Albumin: 4.1 g/dL (ref 3.5–5.0)
Alkaline Phosphatase: 73 U/L (ref 38–126)
Anion gap: 5 (ref 5–15)
BUN: <5 mg/dL — ABNORMAL LOW (ref 6–20)
CO2: 28 mmol/L (ref 22–32)
Calcium: 9.4 mg/dL (ref 8.9–10.3)
Chloride: 107 mmol/L (ref 101–111)
Creatinine, Ser: 0.77 mg/dL (ref 0.44–1.00)
GFR calc Af Amer: >60 mL/min (ref >60)
GFR calc non Af Amer: >60 mL/min (ref >60)
Glucose, Bld: 93 mg/dL (ref 65–99)
Potassium: 3.4 mmol/L — ABNORMAL LOW (ref 3.5–5.1)
Sodium: 140 mmol/L (ref 135–145)
Total Bilirubin: 0.4 mg/dL (ref 0.3–1.2)
Total Protein: 8.1 g/dL (ref 6.5–8.1)

## 2016-10-17 LAB — ACETAMINOPHEN LEVEL: Acetaminophen (Tylenol), Serum: <10 ug/mL — ABNORMAL LOW (ref 10–30)

## 2016-10-17 LAB — CBC
HCT: 37.4 % (ref 36.0–46.0)
Hemoglobin: 12.4 g/dL (ref 12.0–15.0)
MCH: 28.2 pg (ref 26.0–34.0)
MCHC: 33.2 g/dL (ref 30.0–36.0)
MCV: 85.2 fL (ref 78.0–100.0)
Platelets: 265 10*3/uL (ref 150–400)
RBC: 4.39 MIL/uL (ref 3.87–5.11)
RDW: 14.2 % (ref 11.5–15.5)
WBC: 3.9 10*3/uL — ABNORMAL LOW (ref 4.0–10.5)

## 2016-10-17 LAB — PREGNANCY, URINE: Preg Test, Ur: NEGATIVE

## 2016-10-17 LAB — RAPID URINE DRUG SCREEN, HOSP PERFORMED
Amphetamines: NOT DETECTED
Barbiturates: NOT DETECTED
Benzodiazepines: NOT DETECTED
Cocaine: NOT DETECTED
Opiates: NOT DETECTED
Tetrahydrocannabinol: NOT DETECTED

## 2016-10-17 LAB — ETHANOL: Alcohol, Ethyl (B): <5 mg/dL (ref <5)

## 2016-10-17 LAB — SALICYLATE LEVEL: Salicylate Lvl: <7 mg/dL (ref 2.8–30.0)

## 2016-10-17 MED ORDER — STERILE WATER FOR INJECTION IJ SOLN
INTRAMUSCULAR | Status: AC
  Administered 2016-10-17: 16:00:00
  Filled 2016-10-17: qty 10

## 2016-10-17 MED ORDER — ZIPRASIDONE MESYLATE 20 MG IM SOLR
20.0000 mg | Freq: Once | INTRAMUSCULAR | Status: AC
  Administered 2016-10-17: 20 mg via INTRAMUSCULAR
  Filled 2016-10-17: qty 20

## 2016-10-17 MED ORDER — DIPHENHYDRAMINE HCL 50 MG/ML IJ SOLN
50.0000 mg | Freq: Once | INTRAMUSCULAR | Status: AC
  Administered 2016-10-17: 50 mg via INTRAMUSCULAR
  Filled 2016-10-17: qty 1

## 2016-10-17 MED ORDER — LORAZEPAM 2 MG/ML IJ SOLN
2.0000 mg | Freq: Once | INTRAMUSCULAR | Status: AC
  Administered 2016-10-17: 2 mg via INTRAMUSCULAR
  Filled 2016-10-17: qty 1
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
## 2016-10-17 NOTE — ED Notes (Signed)
Patient wanded and belongings placed in locker# 29.
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
## 2016-10-17 NOTE — ED Notes (Signed)
Received report. Patient currently in bed resting drowsy and sleepy. Patient with very little interaction with this Clinical research associatewriter. Patient with Q 15 minute checks in progress, remains safe on unit and no distress noted. Monitoring of patient continues.

## 2016-10-17 NOTE — ED Notes (Signed)
Patient calm and cooperative.  Conversing with [employer] officer.  Patient is saying she wants to go home.

## 2016-10-17 NOTE — ED Notes (Signed)
Bed: [id number] Expected date:  Expected time:  Means of arrival:  Comments: Hold for room 29

## 2016-10-17 NOTE — ED Notes (Signed)
Patient talking to ED provider.
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
## 2016-10-18 DIAGNOSIS — Z87891 Personal history of nicotine dependence: Secondary | ICD-10-CM

## 2016-10-18 DIAGNOSIS — F4324 Adjustment disorder with disturbance of conduct: Secondary | ICD-10-CM | POA: Diagnosis present

## 2016-10-18 MED ORDER — ONDANSETRON 8 MG PO TBDP
8.0000 mg | ORAL_TABLET | Freq: Once | ORAL | Status: AC
  Administered 2016-10-18: 8 mg via ORAL
  Filled 2016-10-18: qty 1
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
## 2016-10-18 NOTE — BH Assessment (Signed)
[facility] Assessment Progress Note  Per Thedore MinsMojeed Akintayo, MD, this pt does not require psychiatric hospitalization at this time.  Pt presents under [facility], which Dr Jannifer FranklinAkintayo has rescinded.  [facility]  This has been included in pt's discharge instructions.  Pt's nurse, Kendal Hymendie, has been notified.  Michelle Canninghomas Aalyiah Camberos, MA Triage Specialist [phone]

## 2016-10-18 NOTE — Discharge Instructions (Signed)
For your ongoing behavioral health needs you are advised to follow up with [facility].  New patients are seen at their walk-in clinic.  Walk-in hours are Monday - Friday from 8:00 am - 12:00 pm, and from 1:00 pm - 3:00 pm.  Walk-in patients are seen on a first come, first served basis, so try to arrive as early as possible for the best chance of being seen the same day.  There is an initial fee of $22.50: ° °     [facility] °     [address] °     [place], [place] [address] °     [phone] °

## 2016-10-18 NOTE — ED Notes (Signed)
Pt discharged safely.  She was calm and cooperative.  All belongings were returned to patient.

## 2017-01-26 ENCOUNTER — Emergency Department (HOSPITAL_COMMUNITY)
Admission: EM | Admit: 2017-01-26 | Discharge: 2017-01-26 | Disposition: A | Discharge status: Home / Self Care | Payer: Self-pay | Attending: Emergency Medicine | Admitting: Emergency Medicine

## 2017-01-26 ENCOUNTER — Encounter (HOSPITAL_COMMUNITY): Payer: Self-pay

## 2017-01-26 ENCOUNTER — Other Ambulatory Visit: Payer: Self-pay

## 2017-01-26 DIAGNOSIS — Z79899 Other long term (current) drug therapy: Secondary | ICD-10-CM | POA: Insufficient documentation

## 2017-01-26 DIAGNOSIS — Z202 Contact with and (suspected) exposure to infections with a predominantly sexual mode of transmission: Secondary | ICD-10-CM | POA: Insufficient documentation

## 2017-01-26 DIAGNOSIS — R1030 Lower abdominal pain, unspecified: Secondary | ICD-10-CM

## 2017-01-26 DIAGNOSIS — Z87891 Personal history of nicotine dependence: Secondary | ICD-10-CM | POA: Insufficient documentation

## 2017-01-26 DIAGNOSIS — Z711 Person with feared health complaint in whom no diagnosis is made: Secondary | ICD-10-CM

## 2017-01-26 DIAGNOSIS — K0889 Other specified disorders of teeth and supporting structures: Secondary | ICD-10-CM | POA: Insufficient documentation

## 2017-01-26 DIAGNOSIS — J45909 Unspecified asthma, uncomplicated: Secondary | ICD-10-CM | POA: Insufficient documentation

## 2017-01-26 DIAGNOSIS — N9489 Other specified conditions associated with female genital organs and menstrual cycle: Secondary | ICD-10-CM | POA: Insufficient documentation

## 2017-01-26 LAB — URINALYSIS, ROUTINE W REFLEX MICROSCOPIC
Bilirubin Urine: NEGATIVE
Glucose, UA: NEGATIVE mg/dL
Hgb urine dipstick: NEGATIVE
Ketones, ur: NEGATIVE mg/dL
Nitrite: NEGATIVE
Protein, ur: NEGATIVE mg/dL
Specific Gravity, Urine: 1.008 (ref 1.005–1.030)
pH: 7 (ref 5.0–8.0)

## 2017-01-26 LAB — CBC
HCT: 36.7 % (ref 36.0–46.0)
Hemoglobin: 11.9 g/dL — ABNORMAL LOW (ref 12.0–15.0)
MCH: 28.4 pg (ref 26.0–34.0)
MCHC: 32.4 g/dL (ref 30.0–36.0)
MCV: 87.6 fL (ref 78.0–100.0)
Platelets: 237 10*3/uL (ref 150–400)
RBC: 4.19 MIL/uL (ref 3.87–5.11)
RDW: 14.4 % (ref 11.5–15.5)
WBC: 5.9 10*3/uL (ref 4.0–10.5)

## 2017-01-26 LAB — COMPREHENSIVE METABOLIC PANEL
ALT: 9 U/L — ABNORMAL LOW (ref 14–54)
AST: 16 U/L (ref 15–41)
Albumin: 3.4 g/dL — ABNORMAL LOW (ref 3.5–5.0)
Alkaline Phosphatase: 84 U/L (ref 38–126)
Anion gap: 6 (ref 5–15)
BUN: 7 mg/dL (ref 6–20)
CO2: 25 mmol/L (ref 22–32)
Calcium: 8.9 mg/dL (ref 8.9–10.3)
Chloride: 105 mmol/L (ref 101–111)
Creatinine, Ser: 0.83 mg/dL (ref 0.44–1.00)
GFR calc Af Amer: >60 mL/min (ref >60)
GFR calc non Af Amer: >60 mL/min (ref >60)
Glucose, Bld: 96 mg/dL (ref 65–99)
Potassium: 3.9 mmol/L (ref 3.5–5.1)
Sodium: 136 mmol/L (ref 135–145)
Total Bilirubin: 0.7 mg/dL (ref 0.3–1.2)
Total Protein: 7 g/dL (ref 6.5–8.1)

## 2017-01-26 LAB — WET PREP, GENITAL
Sperm: NONE SEEN
Trich, Wet Prep: NONE SEEN
Yeast Wet Prep HPF POC: NONE SEEN

## 2017-01-26 LAB — I-STAT BETA HCG BLOOD, ED (MC, WL, AP ONLY): I-stat hCG, quantitative: <5 m[IU]/mL (ref <5)

## 2017-01-26 LAB — LIPASE, BLOOD: Lipase: 32 U/L (ref 11–51)

## 2017-01-26 MED ORDER — LIDOCAINE HCL (PF) 1 % IJ SOLN
INTRAMUSCULAR | Status: AC
  Administered 2017-01-26: 5 mL
  Filled 2017-01-26: qty 5

## 2017-01-26 MED ORDER — AZITHROMYCIN 250 MG PO TABS
1000.0000 mg | ORAL_TABLET | Freq: Once | ORAL | Status: AC
  Administered 2017-01-26: 1000 mg via ORAL
  Filled 2017-01-26: qty 4

## 2017-01-26 MED ORDER — CEFTRIAXONE SODIUM 250 MG IJ SOLR
250.0000 mg | Freq: Once | INTRAMUSCULAR | Status: AC
  Administered 2017-01-26: 250 mg via INTRAMUSCULAR
  Filled 2017-01-26: qty 250

## 2017-01-26 NOTE — Discharge Instructions (Addendum)
Your pregnancy test was negative. You do not have trichomoniasis. Your gonorrhea and chlamydia testing are pending however you were treated for these today in case results are positive.   Your urine was sent for culture today to determine if you have urinary tract infection.   Return for worsening, constant lower abdominal pain associated with nausea, vomiting, abnormal vaginal discharge bleeding or pain with urination

## 2017-01-26 NOTE — ED Provider Notes (Signed)
[facility] Provider Note   CSN: [id number] Arrival date & time: 01/26/17  [id number]     History   Chief Complaint Chief Complaint  Patient presents with  . Abdominal Pain    HPI Michelle Lucas is a 26 y.o. female presents to the ED for evaluation of gradually worsening midline low abdominal discomfort, intermittent, described as crampy 2 days. Last period was September. She has not taken a pregnancy test at home. She is sexually active with men only without condoms or birth control use. Denies previous history of pregnancy or STDs. She is interested and STD testing. Denies fevers, nausea, vomiting, diarrhea, constipation, abnormal vaginal discharge, dysuria or hematuria. No known GI medical conditions, surgeries.  Also reports pain to the left upper gumline. Has taken ibuprofen which temporarily relieved his pain. Went to his orthodontist 2 days ago and she was told that she needed to decrease inflammation before they could extract her teeth. No fevers, trismus, sore throat.  HPI  Past Medical History:  Diagnosis Date  . Anxiety attack   . Asthma   . Pseudoseizures     Patient Active Problem List   Diagnosis Date Noted  . 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 No pertinent surgical history.  OB History    Gravida Para Term Preterm AB Living   0 0 0 0 0 0   SAB TAB Ectopic Multiple Live Births   0 0 0 0 0       Home Medications    Prior to Admission medications   Medication Sig Start Date End Date Taking? Authorizing Provider  ibuprofen (ADVIL,MOTRIN) 200 MG tablet Take 400 mg daily as needed by mouth for moderate pain.   Yes [provider]  metroNIDAZOLE (FLAGYL) 500 MG tablet Take 1 tablet (500 mg total) by mouth 2 (two) times daily. Patient not taking: Reported on 10/17/2016 09/27/16   Orson Slickolson, Andrew, MD  naproxen (NAPROSYN) 375 MG tablet Take 1 tablet (375 mg total) by mouth 2  (two) times daily. Patient not taking: Reported on 09/27/2016 08/01/16   Nicanor AlconPalumbo, April, MD    Family History No family history on file.  Social History Social History   Tobacco Use  . Smoking status: Former Smoker    Types: Cigarettes    Last attempt to quit: 03/19/2015    Years since quitting: 1.8  . Smokeless tobacco: Never Used  Substance Use Topics  . Alcohol use: Yes  . Drug use: No    Comment: every once in a while     Allergies   Penicillins   Review of Systems Review of Systems  HENT: Positive for dental problem.   Gastrointestinal: Positive for abdominal pain.  Genitourinary: Positive for menstrual problem.  All other systems reviewed and are negative.    Physical Exam Updated Vital Signs BP 103/74   Pulse 70   Temp 98.4 F (36.9 C) (Oral)   Resp 14   Ht 5\' 5"  (1.651 m)   Wt 90.7 kg (200 lb)   LMP 11/26/2016   SpO2 100%   BMI 33.28 kg/m   Physical Exam  Constitutional: She is oriented to person, place, and time. She appears well-developed and well-nourished. No distress.  NAD.  HENT:  Head: Normocephalic and atraumatic.  Right Ear: External ear normal.  Left Ear: External ear normal.  Nose: Nose normal.  Mouth/Throat: Abnormal dentition.    Moist mucous membranes Oropharynx and tonsils normal  +Poor  dentition. Mild tenderness at left upper/buccal gumline w/o erythema, edema, fluctuance or abscess No facial or anterior neck edema, erythema or erythema. No sublingual edema or tenderness.  Soft palate flat without tenderness.  No trismus.   No pooling of oral secretions.  Phonation normal, no hot potato voice.  Maxilla and mandible nontender. Mastoids without edema, erythema or tenderness.    Eyes: Conjunctivae and EOM are normal. No scleral icterus.  Neck: Normal range of motion. Neck supple.  Cardiovascular: Normal rate, regular rhythm and normal heart sounds.  No murmur heard. Pulmonary/Chest: Effort normal and breath sounds normal.  She has no wheezes.  Abdominal: Soft. Normal appearance and bowel sounds are normal. There is no tenderness. There is no CVA tenderness.  Abdomen NTND  Genitourinary:  Genitourinary Comments: External genitalia normal without erythema, edema, tenderness, discharge or lesions.  No groin lymphadenopathy.  Vaginal mucosa and cervix normal, pink without discharge or lesions.  Uterus in midline, smooth, not enlarged or tender. No CMT. Non palpable adnexa.  Musculoskeletal: Normal range of motion. She exhibits no deformity.  Neurological: She is alert and oriented to person, place, and time.  Skin: Skin is warm and dry. Capillary refill takes less than 2 seconds.  Psychiatric: She has a normal mood and affect. Her behavior is normal. Judgment and thought content normal.  Nursing note and vitals reviewed.    ED Treatments / Results  Labs (all labs ordered are listed, but only abnormal results are displayed) Labs Reviewed  WET PREP, GENITAL - Abnormal; Notable for the following components:      Result Value   Clue Cells Wet Prep HPF POC PRESENT (*)    WBC, Wet Prep HPF POC PRESENT (*)    All other components within normal limits  COMPREHENSIVE METABOLIC PANEL - Abnormal; Notable for the following components:   Albumin 3.4 (*)    ALT 9 (*)    All other components within normal limits  CBC - Abnormal; Notable for the following components:   Hemoglobin 11.9 (*)    All other components within normal limits  URINALYSIS, ROUTINE W REFLEX MICROSCOPIC - Abnormal; Notable for the following components:   APPearance HAZY (*)    Leukocytes, UA TRACE (*)    Bacteria, UA RARE (*)    Squamous Epithelial / LPF 6-30 (*)    All other components within normal limits  URINE CULTURE  LIPASE, BLOOD  I-STAT BETA HCG BLOOD, ED ([facility], [facility], [facility] ONLY)  GC/CHLAMYDIA PROBE AMP ([place]) NOT AT [facility]    EKG  EKG Interpretation None       Radiology No results found.  Procedures Procedures (including  critical care time)  Medications Ordered in ED Medications  azithromycin (ZITHROMAX) tablet 1,000 mg (1,000 mg Oral Given 01/26/17 1130)  cefTRIAXone (ROCEPHIN) injection 250 mg (250 mg Intramuscular Given 01/26/17 1130)  lidocaine (PF) (XYLOCAINE) 1 % injection (5 mLs  Given 01/26/17 1130)     Initial Impression / Assessment and Plan / ED Course  I have reviewed the triage vital signs and the nursing notes.  Pertinent labs & imaging results that were available during my care of the patient were reviewed by me and considered in my medical decision making (see chart for details).    26 year old female presents with lower abdominal pain 2 days. She is requesting STD testing. High-risk sexual practices without condom use. Last menstrual period was 2 months ago. Fevers, nausea, vomiting, diarrhea, constipation, dysuria or abnormal vaginal discharge or bleeding.  Exam is  unremarkable, currently not having any abdominal pain. Abdomen is benign. Pelvic exam performed, also unremarkable.  Lab work is reassuring. No leukocytosis. She has trace leukocytes and clue cells however she has no urinary or vaginal symptoms today. She was empirically treated for STDs today. She declined HIV/syphilis testing.  Will discharge at this time. She is aware GC/chlamydia testing is pending and she will be notified via phone call. She also reported long-standing left upper dental pain, was evaluated by orthopedist on test 2 days ago. No evidence of abscess or facial swelling. Recommended NSAIDs and will refer back to dentist.  Final Clinical Impressions(s) / ED Diagnoses   Final diagnoses:  Lower abdominal pain    ED Discharge Orders    None       Liberty HandyGibbons, Lourie Retz J, PA-C 01/26/17 1336    Gwyneth SproutPlunkett, Whitney, MD 01/31/17 1940

## 2017-01-26 NOTE — ED Triage Notes (Signed)
Patient complains of 2 days of lower abdominal pain with sharp crampy pain. States that she has not had a period since September and has not done home pregnancy test. Alert and oriented, NAD

## 2017-01-27 LAB — URINE CULTURE: Culture: NO GROWTH

## 2017-01-27 LAB — GC/CHLAMYDIA PROBE AMP (~~LOC~~) NOT AT ARMC
Chlamydia: NEGATIVE
Neisseria Gonorrhea: NEGATIVE

## 2017-02-15 ENCOUNTER — Emergency Department (HOSPITAL_COMMUNITY): Payer: Self-pay

## 2017-02-15 ENCOUNTER — Encounter (HOSPITAL_COMMUNITY): Payer: Self-pay

## 2017-02-15 ENCOUNTER — Emergency Department (HOSPITAL_COMMUNITY)
Admission: EM | Admit: 2017-02-15 | Discharge: 2017-02-15 | Disposition: A | Discharge status: Home / Self Care | Payer: Self-pay | Attending: Emergency Medicine | Admitting: Emergency Medicine

## 2017-02-15 ENCOUNTER — Other Ambulatory Visit: Payer: Self-pay

## 2017-02-15 DIAGNOSIS — R519 Headache, unspecified: Secondary | ICD-10-CM

## 2017-02-15 DIAGNOSIS — R51 Headache: Secondary | ICD-10-CM | POA: Insufficient documentation

## 2017-02-15 DIAGNOSIS — Z87891 Personal history of nicotine dependence: Secondary | ICD-10-CM | POA: Insufficient documentation

## 2017-02-15 DIAGNOSIS — Z79899 Other long term (current) drug therapy: Secondary | ICD-10-CM | POA: Insufficient documentation

## 2017-02-15 DIAGNOSIS — J45909 Unspecified asthma, uncomplicated: Secondary | ICD-10-CM | POA: Insufficient documentation

## 2017-02-15 LAB — POC URINE PREG, ED: Preg Test, Ur: NEGATIVE

## 2017-02-15 MED ORDER — TRAMADOL HCL 50 MG PO TABS
50.0000 mg | ORAL_TABLET | Freq: Once | ORAL | Status: AC
  Administered 2017-02-15: 50 mg via ORAL
  Filled 2017-02-15: qty 1

## 2017-02-15 MED ORDER — IBUPROFEN 800 MG PO TABS
800.0000 mg | ORAL_TABLET | Freq: Once | ORAL | Status: AC
  Administered 2017-02-15: 800 mg via ORAL
  Filled 2017-02-15: qty 1
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
## 2017-02-15 NOTE — ED Notes (Signed)
[facility] Expected date:  Expected time:  Means of arrival:  Comments: Hold triage
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
## 2017-02-15 NOTE — Discharge Instructions (Addendum)
You were seen in the emergency department for left-sided facial pain and swelling.  Your CT scan did not show any fractures or dislocations. Your pregnancy test was negative. You were given 1 dose of tramadol during your visit for pain.   Apply ice to the area 20 minutes every hour.  Take ibuprofen and Tylenol for pain- you may alternate these.  Follow up with your primary care provider in 5 days for reevaluation-provided the contact information for a local primary care doctor as well as the contact information for the community clinic.  Return to the emergency department for any new or worsening symptoms including but not limited to loss of vision, vision, persistent headache, nausea or vomiting.

## 2017-05-16 DIAGNOSIS — Z8619 Personal history of other infectious and parasitic diseases: Secondary | ICD-10-CM

## 2017-05-16 HISTORY — DX: Personal history of other infectious and parasitic diseases: Z86.19

## 2017-06-11 ENCOUNTER — Emergency Department (HOSPITAL_COMMUNITY)
Admission: EM | Admit: 2017-06-11 | Discharge: 2017-06-11 | Disposition: A | Discharge status: Home / Self Care | Payer: Self-pay | Attending: Emergency Medicine | Admitting: Emergency Medicine

## 2017-06-11 ENCOUNTER — Other Ambulatory Visit: Payer: Self-pay

## 2017-06-11 DIAGNOSIS — J45909 Unspecified asthma, uncomplicated: Secondary | ICD-10-CM | POA: Insufficient documentation

## 2017-06-11 DIAGNOSIS — N390 Urinary tract infection, site not specified: Secondary | ICD-10-CM

## 2017-06-11 LAB — URINALYSIS, ROUTINE W REFLEX MICROSCOPIC
Bilirubin Urine: NEGATIVE
Glucose, UA: NEGATIVE mg/dL
Hgb urine dipstick: NEGATIVE
Ketones, ur: NEGATIVE mg/dL
Nitrite: NEGATIVE
Protein, ur: NEGATIVE mg/dL
Specific Gravity, Urine: 1.004 — ABNORMAL LOW (ref 1.005–1.030)
pH: 8 (ref 5.0–8.0)

## 2017-06-11 LAB — COMPREHENSIVE METABOLIC PANEL
ALT: 12 U/L — ABNORMAL LOW (ref 14–54)
AST: 16 U/L (ref 15–41)
Albumin: 3.6 g/dL (ref 3.5–5.0)
Alkaline Phosphatase: 63 U/L (ref 38–126)
Anion gap: 9 (ref 5–15)
BUN: <5 mg/dL — ABNORMAL LOW (ref 6–20)
CO2: 24 mmol/L (ref 22–32)
Calcium: 9.1 mg/dL (ref 8.9–10.3)
Chloride: 106 mmol/L (ref 101–111)
Creatinine, Ser: 0.74 mg/dL (ref 0.44–1.00)
GFR calc Af Amer: >60 mL/min (ref >60)
GFR calc non Af Amer: >60 mL/min (ref >60)
Glucose, Bld: 94 mg/dL (ref 65–99)
Potassium: 4 mmol/L (ref 3.5–5.1)
Sodium: 139 mmol/L (ref 135–145)
Total Bilirubin: 0.6 mg/dL (ref 0.3–1.2)
Total Protein: 7 g/dL (ref 6.5–8.1)

## 2017-06-11 LAB — CBC
HCT: 36.6 % (ref 36.0–46.0)
Hemoglobin: 11.9 g/dL — ABNORMAL LOW (ref 12.0–15.0)
MCH: 29 pg (ref 26.0–34.0)
MCHC: 32.5 g/dL (ref 30.0–36.0)
MCV: 89.3 fL (ref 78.0–100.0)
Platelets: 276 10*3/uL (ref 150–400)
RBC: 4.1 MIL/uL (ref 3.87–5.11)
RDW: 15.1 % (ref 11.5–15.5)
WBC: 5.9 10*3/uL (ref 4.0–10.5)

## 2017-06-11 LAB — HCG, QUANTITATIVE, PREGNANCY: hCG, Beta Chain, Quant, S: <1 m[IU]/mL (ref <5)

## 2017-06-11 MED ORDER — SULFAMETHOXAZOLE-TRIMETHOPRIM 800-160 MG PO TABS: 1.0000 | ORAL_TABLET | Freq: Two times a day (BID) | ORAL | 0 refills | Status: AC

## 2017-06-11 NOTE — ED Provider Notes (Addendum)
[facility] EMERGENCY DEPARTMENT Provider Note   CSN: [id number] Arrival date & time: 06/11/17  4098     History   Chief Complaint Chief Complaint  Patient presents with  . Abdominal Pain  . Sore Throat    HPI Michelle Lucas is a 27 y.o. female.  HPI  27 year old female presents today complaining of lower abdominal pain and vaginal discharge.  Number 3 days since her last menstrual cycle.  She reports her last menstrual cycle was 3 days ago and that she has irregular menses.  Reports being sexually active with same partner.  She reports no prior pregnancies and no birth control use.  States that she has been told she had bacterial vaginosis on multiple visits but has not had any other sexually transmitted diseases.  She has not sought care outside the ED.  Past Medical History:  Diagnosis Date  . Anxiety attack   . Asthma   . Pseudoseizures     Patient Active Problem List   Diagnosis Date Noted  . 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   OB History    Gravida  0   Para  0   Term  0   Preterm  0   AB  0   Living  0     SAB  0   TAB  0   Ectopic  0   Multiple  0   Live Births  0            Home Medications    Prior to Admission medications   Medication Sig Start Date End Date Taking? Authorizing Provider  ibuprofen (ADVIL,MOTRIN) 200 MG tablet Take 400 mg daily as needed by mouth for moderate pain.    [provider]    Family History No family history on file.  Social History Social History   Tobacco Use  . Smoking status: Former Smoker    Types: Cigarettes    Last attempt to quit: 03/19/2015    Years since quitting: 2.2  . Smokeless tobacco: Never Used  Substance Use Topics  . Alcohol use: Yes  . Drug use: No    Types: Marijuana    Comment: every once in a while     Allergies   Penicillins   Review of Systems Review of Systems  All other  systems reviewed and are negative.    Physical Exam Updated Vital Signs BP 117/65 (BP Location: Right Arm)   Pulse 66   Temp 98.3 F (36.8 C) (Oral)   Resp 14   LMP 06/07/2017   SpO2 100%   Physical Exam  Constitutional: She is oriented to person, place, and time. She appears well-developed and well-nourished.  HENT:  Head: Normocephalic and atraumatic.  Eyes: Pupils are equal, round, and reactive to light. EOM are normal.  Cardiovascular: Normal rate.  Pulmonary/Chest: Effort normal.  Abdominal: Normal appearance and bowel sounds are normal.  Genitourinary: Uterus normal. Cervix exhibits no motion tenderness. Right adnexum displays no mass and no tenderness. Left adnexum displays no mass and no tenderness. No bleeding in the vagina. Vaginal discharge found.  Genitourinary Comments: Scant white discharge Mild suprapubic ttp No cervical motion tenderness  Neurological: She is oriented to person, place, and time.  Skin: Skin is warm. Capillary refill takes less than 2 seconds.  Psychiatric: She has a normal mood and affect. Her behavior is normal.  Nursing note and vitals reviewed.  ED Treatments / Results  Labs (all labs ordered are listed, but only abnormal results are displayed) Labs Reviewed  COMPREHENSIVE METABOLIC PANEL - Abnormal; Notable for the following components:      Result Value   BUN <5 (*)    ALT 12 (*)    All other components within normal limits  CBC - Abnormal; Notable for the following components:   Hemoglobin 11.9 (*)    All other components within normal limits  URINALYSIS, ROUTINE W REFLEX MICROSCOPIC - Abnormal; Notable for the following components:   Color, Urine STRAW (*)    Specific Gravity, Urine 1.004 (*)    Leukocytes, UA LARGE (*)    Bacteria, UA FEW (*)    Squamous Epithelial / LPF 6-30 (*)    All other components within normal limits  HCG, QUANTITATIVE, PREGNANCY  GC/CHLAMYDIA PROBE AMP ([place]) NOT AT [facility]     EKG None  Radiology No results found.  Procedures Procedures (including critical care time)  Medications Ordered in ED Medications - No data to display   Initial Impression / Assessment and Plan / ED Course  I have reviewed the triage vital signs and the nursing notes.  Pertinent labs & imaging results that were available during my care of the patient were reviewed by me and considered in my medical decision making (see chart for details).   27 year old female with some suprapubic tenderness but no cervical motion tenderness or significant vaginal discharge.  Urinary tract infection identified with urinalysis and consistent with symptoms.  I do not think patient needs further testing for her vaginal discharge at this time.  She has been tested multiple times in the past and has not had any STDs and states that she has been with the same partner.  Patient is allergic to penicillin with a swelling and rash.  She is prescribed Bactrim for urinary tract infection.  Advised regarding need for return precautions and follow-up   Final Clinical Impressions(s) / ED Diagnoses   Final diagnoses:  Urinary tract infection without hematuria, site unspecified    ED Discharge Orders    None       Margarita Grizzleay, Dahlton Hinde, MD 06/11/17 1135    Margarita Grizzleay, Emigdio Wildeman, MD 06/11/17 1137

## 2017-06-11 NOTE — ED Triage Notes (Signed)
Pt has been having sore throat and cough, unrelieved with OTC cough syrup. Also c/o lower abd pain with vaginal discharge

## 2017-06-12 LAB — GC/CHLAMYDIA PROBE AMP (~~LOC~~) NOT AT ARMC
Chlamydia: NEGATIVE
Neisseria Gonorrhea: POSITIVE — AB

## 2017-08-02 ENCOUNTER — Other Ambulatory Visit: Payer: Self-pay

## 2017-08-02 ENCOUNTER — Encounter (HOSPITAL_COMMUNITY): Payer: Self-pay | Admitting: Student

## 2017-08-02 ENCOUNTER — Inpatient Hospital Stay (HOSPITAL_COMMUNITY)
Admission: AD | Admit: 2017-08-02 | Discharge: 2017-08-02 | Disposition: A | Discharge status: Home / Self Care | Payer: Self-pay | Source: Ambulatory Visit | Attending: Obstetrics and Gynecology | Admitting: Obstetrics and Gynecology

## 2017-08-02 DIAGNOSIS — J029 Acute pharyngitis, unspecified: Secondary | ICD-10-CM | POA: Insufficient documentation

## 2017-08-02 DIAGNOSIS — Z3202 Encounter for pregnancy test, result negative: Secondary | ICD-10-CM | POA: Insufficient documentation

## 2017-08-02 DIAGNOSIS — Z87891 Personal history of nicotine dependence: Secondary | ICD-10-CM | POA: Insufficient documentation

## 2017-08-02 DIAGNOSIS — J069 Acute upper respiratory infection, unspecified: Secondary | ICD-10-CM | POA: Insufficient documentation

## 2017-08-02 HISTORY — DX: Personal history of other infectious and parasitic diseases: Z86.19

## 2017-08-02 LAB — URINALYSIS, ROUTINE W REFLEX MICROSCOPIC
Bilirubin Urine: NEGATIVE
Glucose, UA: NEGATIVE mg/dL
Hgb urine dipstick: NEGATIVE
Ketones, ur: NEGATIVE mg/dL
Leukocytes, UA: NEGATIVE
Nitrite: NEGATIVE
Protein, ur: NEGATIVE mg/dL
Specific Gravity, Urine: <1.005 — ABNORMAL LOW (ref 1.005–1.030)
pH: 6 (ref 5.0–8.0)

## 2017-08-02 LAB — GROUP A STREP BY PCR: Group A Strep by PCR: NOT DETECTED

## 2017-08-02 LAB — POCT PREGNANCY, URINE: Preg Test, Ur: NEGATIVE

## 2017-08-02 MED ORDER — METOCLOPRAMIDE HCL 10 MG PO TABS: 10.0000 mg | ORAL_TABLET | Freq: Four times a day (QID) | ORAL | 0 refills | Status: DC

## 2017-08-02 MED ORDER — AZITHROMYCIN 200 MG/5ML PO SUSR: 500.0000 mg | Freq: Every day | ORAL | 0 refills | Status: AC

## 2017-08-02 NOTE — MAU Provider Note (Signed)
History     CSN: [id number]  Arrival date and time: 08/02/17 9562   First Provider Initiated Contact with Patient 08/02/17 1026      Chief Complaint  Patient presents with  . Cough  . Sore Throat  . Nausea   HPI  RAMYA VANBERGEN is a 27 y.o. non pregnant female who presents with sore throat and cough. States she was seen at [facility] 3 days ago and was "not treated appropriately" (this visit is not found in Epic).  Symptoms began 3 days. Cough is productive of green/yellow mucous. She denies wheezing or SOB. History of asthma; uses albuterol inhaler PRN which she hasn't used "in a long time". Asthma is managed by her PCP in [place], but she doesn't know the name of the practice. Has never been hospitalized or intubated d/t asthma. Reports fever last night of 105.3.  Endorses sore throat and left ear pain. 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  Rates throat pain 5/10. Has not treated any of her symptoms. Endorses some nausea since symptoms started; no vomiting and currently not nauseated.  Treated for gonorrhea a few months ago. States her partner was treated as well. Denies oral sex.   Past Medical History:  Diagnosis Date  . Anxiety attack    does not take meds  . Asthma    PRN inhaler   . Hx of gonorrhea 05/2017  . Pseudoseizures     No past surgical history on file.  Family History  Problem Relation Age of Onset  . Depression Mother   . Hypertension Mother   . Depression Father   . Hypertension Father   . Depression Sister   . Depression Brother   . Hypertension Maternal Aunt   . COPD Maternal Uncle   . Hypertension Maternal Uncle   . Hypertension Maternal Grandmother   . Hypertension Maternal Grandfather   . Asthma Paternal Grandmother     Social History   Tobacco Use  . Smoking status: Former Smoker    Types: Cigarettes    Last attempt to quit: 03/19/2015    Years since quitting: 2.3   . Smokeless tobacco: Never Used  Substance Use Topics  . Alcohol use: Yes  . Drug use: No    Types: Marijuana    Comment: every once in a while - 5-6 years ago     Allergies:  Allergies  Allergen Reactions  . Penicillins Swelling    Swelling at injection site Has patient had a PCN reaction causing immediate rash, facial/tongue/throat swelling, SOB or lightheadedness with hypotension: Yes Has patient had a PCN reaction causing severe rash involving mucus membranes or skin necrosis: No Has patient had a PCN reaction that required hospitalization: No Has patient had a PCN reaction occurring within the last 10 years: Yes If all of the above answers are "NO", then may proceed with Cephalosporin use.     Medications Prior to Admission  Medication Sig Dispense Refill Last Dose  . ibuprofen (ADVIL,MOTRIN) 200 MG tablet Take 400 mg daily as needed by mouth for moderate pain.   01/25/2017 at Unknown time    Review of Systems  Constitutional: Positive for fever. Negative for chills.  HENT: Positive for ear pain, postnasal drip and sore throat. Negative for sinus pressure.   Respiratory: Positive for cough. Negative for shortness of breath and wheezing.   Gastrointestinal: Positive for nausea. Negative for vomiting.   Physical Exam   Blood  pressure 117/79, temperature 98.4 F (36.9 C), temperature source Oral, resp. rate 18, height 5' 5.5" (1.664 m), weight 180 lb 1.9 oz (81.7 kg), last menstrual period 05/07/2017, SpO2 97 %.  Physical Exam  Nursing note and vitals reviewed. Constitutional: She is oriented to person, place, and time. She appears well-developed and well-nourished. No distress.  HENT:  Head: Normocephalic and atraumatic.  Right Ear: Tympanic membrane normal.  Left Ear: Tympanic membrane normal.  Mouth/Throat: Posterior oropharyngeal erythema present. No oropharyngeal exudate or posterior oropharyngeal edema.  Eyes: Conjunctivae are normal. Right eye exhibits no  discharge. Left eye exhibits no discharge. No scleral icterus.  Neck: Normal range of motion.  Cardiovascular: Normal rate, regular rhythm and normal heart sounds.  No murmur heard. Respiratory: Effort normal and breath sounds normal. No respiratory distress. She has no wheezes.  GI: Soft.  Neurological: She is alert and oriented to person, place, and time.  Skin: Skin is warm and dry. She is not diaphoretic.  Psychiatric: She has a normal mood and affect. Her behavior is normal. Judgment and thought content normal.    MAU Course  Procedures Results for orders placed or performed during the hospital encounter of 08/02/17 (from the past 24 hour(s))  Urinalysis, Routine w reflex microscopic     Status: Abnormal   Collection Time: 08/02/17  9:45 AM  Result Value Ref Range   Color, Urine YELLOW YELLOW   APPearance CLEAR CLEAR   Specific Gravity, Urine <1.005 (L) 1.005 - 1.030   pH 6.0 5.0 - 8.0   Glucose, UA NEGATIVE NEGATIVE mg/dL   Hgb urine dipstick NEGATIVE NEGATIVE   Bilirubin Urine NEGATIVE NEGATIVE   Ketones, ur NEGATIVE NEGATIVE mg/dL   Protein, ur NEGATIVE NEGATIVE mg/dL   Nitrite NEGATIVE NEGATIVE   Leukocytes, UA NEGATIVE NEGATIVE  Pregnancy, urine POC     Status: None   Collection Time: 08/02/17 10:06 AM  Result Value Ref Range   Preg Test, Ur NEGATIVE NEGATIVE  Group A Strep by PCR     Status: None   Collection Time: 08/02/17 10:42 AM  Result Value Ref Range   Group A Strep by PCR NOT DETECTED NOT DETECTED    MDM UPT negative VSS, pt afebrile, SpO2 97%  Strep swab collected --- negative  Pt stable. Discussed tx of cold symptoms. Will give course of abx d/t complaint of fever & phlegm with asthma. Stressed importance of f/u with her PCP & discussed appropriate place to return if symptoms or worsen.   Assessment and Plan  A: 1. Upper respiratory tract infection, unspecified type   2. Pregnancy examination or test, negative result   3. Sore throat     P; Discharge home Rx azithromycin & phenergan Discussed reasons to return to MAU vs ED F/u with PCP Symptomatic tx with OTC meds  Judeth Horn 08/02/2017, 10:30 AM

## 2017-08-02 NOTE — MAU Note (Addendum)
Patient c/o  +cough Productive green and yellow mucus +sneezing +sore throat +N/V +fever at home 105.3 yesterday +headache States was seen for this and was told she had a cold but feels like she was not adequately taken care of.  LMP 05/07/17 Has not taken HPT  Took liquid ibuprofen cold and fever medication 2 hours ago she reports.

## 2017-08-02 NOTE — Progress Notes (Addendum)
Nonpregnant here for cold symptoms that started 2-3 days ago with sore throat. Progressively got worse with sneezing and coughing.   No medication taken for the above  Hx of asthma and takes PRN inhaler as needed.   Temperature 98.4   Provider at bs assessing. Ordered for throat swab for strep. Throat swab done per order.  1110: dc orders given with pt understanding. Pt left unit via ambulatory with friend.

## 2017-08-02 NOTE — Discharge Instructions (Signed)
Upper Respiratory Infection, Adult Most upper respiratory infections (URIs) are a viral infection of the air passages leading to the lungs. A URI affects the nose, throat, and upper air passages. The most common type of URI is nasopharyngitis and is typically referred to as "the common cold." URIs run their course and usually go away on their own. Most of the time, a URI does not require medical attention, but sometimes a bacterial infection in the upper airways can follow a viral infection. This is called a secondary infection. Sinus and middle ear infections are common types of secondary upper respiratory infections. Bacterial pneumonia can also complicate a URI. A URI can worsen asthma and chronic obstructive pulmonary disease (COPD). Sometimes, these complications can require emergency medical care and may be life threatening. What are the causes? Almost all URIs are caused by viruses. A virus is a type of germ and can spread from one person to another. What increases the risk? You may be at risk for a URI if:  You smoke.  You have chronic heart or lung disease.  You have a weakened defense (immune) system.  You are very young or very old.  You have nasal allergies or asthma.  You work in crowded or poorly ventilated areas.  You work in health care facilities or schools.  What are the signs or symptoms? Symptoms typically develop 2-3 days after you come in contact with a cold virus. Most viral URIs last 7-10 days. However, viral URIs from the influenza virus (flu virus) can last 14-18 days and are typically more severe. Symptoms may include:  Runny or stuffy (congested) nose.  Sneezing.  Cough.  Sore throat.  Headache.  Fatigue.  Fever.  Loss of appetite.  Pain in your forehead, behind your eyes, and over your cheekbones (sinus pain).  Muscle aches.  How is this diagnosed? Your health care provider may diagnose a URI by:  Physical exam.  Tests to check that your  symptoms are not due to another condition such as: ? Strep throat. ? Sinusitis. ? Pneumonia. ? Asthma.  How is this treated? A URI goes away on its own with time. It cannot be cured with medicines, but medicines may be prescribed or recommended to relieve symptoms. Medicines may help:  Reduce your fever.  Reduce your cough.  Relieve nasal congestion.  Follow these instructions at home:  Take medicines only as directed by your health care provider.  Gargle warm saltwater or take cough drops to comfort your throat as directed by your health care provider.  Use a warm mist humidifier or inhale steam from a shower to increase air moisture. This may make it easier to breathe.  Drink enough fluid to keep your urine clear or pale yellow.  Eat soups and other clear broths and maintain good nutrition.  Rest as needed.  Return to work when your temperature has returned to normal or as your health care provider advises. You may need to stay home longer to avoid infecting others. You can also use a face mask and careful hand washing to prevent spread of the virus.  Increase the usage of your inhaler if you have asthma.  Do not use any tobacco products, including cigarettes, chewing tobacco, or electronic cigarettes. If you need help quitting, ask your health care provider. How is this prevented? The best way to protect yourself from getting a cold is to practice good hygiene.  Avoid oral or hand contact with people with cold symptoms.  Wash your   hands often if contact occurs.  There is no clear evidence that vitamin C, vitamin E, echinacea, or exercise reduces the chance of developing a cold. However, it is always recommended to get plenty of rest, exercise, and practice good nutrition. Contact a health care provider if:  You are getting worse rather than better.  Your symptoms are not controlled by medicine.  You have chills.  You have worsening shortness of breath.  You have  brown or red mucus.  You have yellow or brown nasal discharge.  You have pain in your face, especially when you bend forward.  You have a fever.  You have swollen neck glands.  You have pain while swallowing.  You have white areas in the back of your throat. Get help right away if:  You have severe or persistent: ? Headache. ? Ear pain. ? Sinus pain. ? Chest pain.  You have chronic lung disease and any of the following: ? Wheezing. ? Prolonged cough. ? Coughing up blood. ? A change in your usual mucus.  You have a stiff neck.  You have changes in your: ? Vision. ? Hearing. ? Thinking. ? Mood. This information is not intended to replace advice given to you by your health care provider. Make sure you discuss any questions you have with your health care provider. Document Released: 08/28/2000 Document Revised: 11/05/2015 Document Reviewed: 06/09/2013 Elsevier Interactive Patient Education  2018 [employer]  

## 2017-08-28 ENCOUNTER — Emergency Department (HOSPITAL_COMMUNITY)
Admission: EM | Admit: 2017-08-28 | Discharge: 2017-08-28 | Disposition: A | Discharge status: Home / Self Care | Payer: Self-pay | Attending: Emergency Medicine | Admitting: Emergency Medicine

## 2017-08-28 ENCOUNTER — Encounter (HOSPITAL_COMMUNITY): Payer: Self-pay

## 2017-08-28 ENCOUNTER — Other Ambulatory Visit: Payer: Self-pay

## 2017-08-28 DIAGNOSIS — R1030 Lower abdominal pain, unspecified: Secondary | ICD-10-CM

## 2017-08-28 DIAGNOSIS — N760 Acute vaginitis: Secondary | ICD-10-CM | POA: Insufficient documentation

## 2017-08-28 DIAGNOSIS — B9689 Other specified bacterial agents as the cause of diseases classified elsewhere: Secondary | ICD-10-CM | POA: Insufficient documentation

## 2017-08-28 DIAGNOSIS — Z87891 Personal history of nicotine dependence: Secondary | ICD-10-CM | POA: Insufficient documentation

## 2017-08-28 DIAGNOSIS — J45909 Unspecified asthma, uncomplicated: Secondary | ICD-10-CM | POA: Insufficient documentation

## 2017-08-28 LAB — WET PREP, GENITAL
Sperm: NONE SEEN
Trich, Wet Prep: NONE SEEN
Yeast Wet Prep HPF POC: NONE SEEN

## 2017-08-28 LAB — COMPREHENSIVE METABOLIC PANEL
ALT: 10 U/L — ABNORMAL LOW (ref 14–54)
AST: 15 U/L (ref 15–41)
Albumin: 4 g/dL (ref 3.5–5.0)
Alkaline Phosphatase: 73 U/L (ref 38–126)
Anion gap: 8 (ref 5–15)
BUN: 7 mg/dL (ref 6–20)
CO2: 26 mmol/L (ref 22–32)
Calcium: 9.2 mg/dL (ref 8.9–10.3)
Chloride: 107 mmol/L (ref 101–111)
Creatinine, Ser: 0.73 mg/dL (ref 0.44–1.00)
GFR calc Af Amer: >60 mL/min (ref >60)
GFR calc non Af Amer: >60 mL/min (ref >60)
Glucose, Bld: 97 mg/dL (ref 65–99)
Potassium: 3.3 mmol/L — ABNORMAL LOW (ref 3.5–5.1)
Sodium: 141 mmol/L (ref 135–145)
Total Bilirubin: 0.5 mg/dL (ref 0.3–1.2)
Total Protein: 7.9 g/dL (ref 6.5–8.1)

## 2017-08-28 LAB — URINALYSIS, ROUTINE W REFLEX MICROSCOPIC
Bilirubin Urine: NEGATIVE
Glucose, UA: NEGATIVE mg/dL
Ketones, ur: NEGATIVE mg/dL
Leukocytes, UA: NEGATIVE
Nitrite: NEGATIVE
Protein, ur: NEGATIVE mg/dL
Specific Gravity, Urine: 1.013 (ref 1.005–1.030)
pH: 6 (ref 5.0–8.0)

## 2017-08-28 LAB — CBC
HCT: 36.8 % (ref 36.0–46.0)
Hemoglobin: 12.1 g/dL (ref 12.0–15.0)
MCH: 28.6 pg (ref 26.0–34.0)
MCHC: 32.9 g/dL (ref 30.0–36.0)
MCV: 87 fL (ref 78.0–100.0)
Platelets: 290 10*3/uL (ref 150–400)
RBC: 4.23 MIL/uL (ref 3.87–5.11)
RDW: 14.7 % (ref 11.5–15.5)
WBC: 4.3 10*3/uL (ref 4.0–10.5)

## 2017-08-28 LAB — LIPASE, BLOOD: Lipase: 30 U/L (ref 11–51)

## 2017-08-28 LAB — I-STAT BETA HCG BLOOD, ED (MC, WL, AP ONLY): I-stat hCG, quantitative: <5 m[IU]/mL (ref <5)

## 2017-08-28 MED ORDER — METRONIDAZOLE 500 MG PO TABS: 500.0000 mg | ORAL_TABLET | Freq: Two times a day (BID) | ORAL | 0 refills | Status: AC

## 2017-08-28 MED ORDER — FLUCONAZOLE 200 MG PO TABS: 200.0000 mg | ORAL_TABLET | Freq: Every day | ORAL | 1 refills | Status: AC

## 2017-08-28 NOTE — ED Provider Notes (Signed)
Emergency Department Provider Note   I have reviewed the triage vital signs and the nursing notes.   HISTORY  Chief Complaint Abdominal Pain and vaginal bump   HPI Michelle Lucas is a 27 y.o. female with PMH of asthma presents to the emergency department for evaluation of cramping lower abdominal pain for the past 2 days with an associated "bump" in the vaginal area.  Patient states her developed pain feels like menstrual cramping but she finished her menstrual cycle yesterday.  Denies any vaginal discharge.  She has no concern for STD exposure.  She denies any fevers or chills.  No nausea or vomiting.  States that the bump is not painful or draining.   Past Medical History:  Diagnosis Date  . Anxiety attack    does not take meds  . Asthma    PRN inhaler   . Hx of gonorrhea 05/2017  . Pseudoseizures     Patient Active Problem List   Diagnosis Date Noted  . 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 No pertinent surgical history.  Current Outpatient Rx  . Order #: [id number] Class: Print  . Order #: [id number] Class: Normal  . Order #: [id number] Class: Print    Allergies Penicillins  Family History  Problem Relation Age of Onset  . Depression Mother   . Hypertension Mother   . Depression Father   . Hypertension Father   . Depression Sister   . Depression Brother   . Hypertension Maternal Aunt   . COPD Maternal Uncle   . Hypertension Maternal Uncle   . Hypertension Maternal Grandmother   . Hypertension Maternal Grandfather   . Asthma Paternal Grandmother     Social History Social History   Tobacco Use  . Smoking status: Former Smoker    Types: Cigarettes    Last attempt to quit: 03/19/2015    Years since quitting: 2.4  . Smokeless tobacco: Never Used  Substance Use Topics  . Alcohol use: Not Currently  . Drug use: Not Currently    Types: Marijuana    Comment: every once in a while - 5-6 years ago     Review of  Systems  Constitutional: No fever/chills Eyes: No visual changes. ENT: No sore throat. Cardiovascular: Denies chest pain. Respiratory: Denies shortness of breath. Gastrointestinal: Positive cramping lower abdominal pain. No nausea, no vomiting.  No diarrhea.  No constipation. Genitourinary: Negative for dysuria. Positive vaginal lesion (non-painful).  Musculoskeletal: Negative for back pain. Skin: Negative for rash. Neurological: Negative for headaches, focal weakness or numbness.  10-point ROS otherwise negative.  ____________________________________________   PHYSICAL EXAM:  VITAL SIGNS: ED Triage Vitals  Enc Vitals Group     BP 08/28/17 1606 117/80     Pulse Rate 08/28/17 1606 91     Resp 08/28/17 1606 16     Temp 08/28/17 1606 98.6 F (37 C)     Temp Source 08/28/17 1606 Oral     SpO2 08/28/17 1606 98 %     Weight 08/28/17 1609 155 lb (70.3 kg)     Height 08/28/17 1609 5' 5.5" (1.664 m)     Pain Score 08/28/17 1608 2   Constitutional: Alert and oriented. Well appearing and in no acute distress. Eyes: Conjunctivae are normal.  Head: Atraumatic. Nose: No congestion/rhinnorhea. Mouth/Throat: Mucous membranes are moist.  Neck: No stridor.   Cardiovascular: Normal rate, regular rhythm. Good peripheral circulation. Grossly normal heart sounds.   Respiratory: Normal respiratory effort.  No retractions.  Lungs CTAB. Gastrointestinal: Soft and nontender. No distention.  Genitourinary: Exam performed with nurse chaperone. Small amount of brown discharge. No CMT. Mild right adnexal tenderness without mass. Normal left adnexa.  Musculoskeletal: No lower extremity tenderness nor edema. No gross deformities of extremities. Neurologic:  Normal speech and language. No gross focal neurologic deficits are appreciated.  Skin:  Skin is warm, dry and intact. No rash noted.   ____________________________________________   LABS (all labs ordered are listed, but only abnormal results  are displayed)  Labs Reviewed  WET PREP, GENITAL - Abnormal; Notable for the following components:      Result Value   Clue Cells Wet Prep HPF POC PRESENT (*)    WBC, Wet Prep HPF POC FEW (*)    All other components within normal limits  COMPREHENSIVE METABOLIC PANEL - Abnormal; Notable for the following components:   Potassium 3.3 (*)    ALT 10 (*)    All other components within normal limits  URINALYSIS, ROUTINE W REFLEX MICROSCOPIC - Abnormal; Notable for the following components:   Hgb urine dipstick MODERATE (*)    Bacteria, UA RARE (*)    All other components within normal limits  LIPASE, BLOOD  CBC  I-STAT BETA HCG BLOOD, ED ([facility], [facility], [facility] ONLY)  GC/CHLAMYDIA PROBE AMP ([place]) NOT AT [facility]   ____________________________________________  RADIOLOGY  None ____________________________________________   PROCEDURES  Procedure(s) performed:   Procedures  None ____________________________________________   INITIAL IMPRESSION / ASSESSMENT AND PLAN / ED COURSE  Pertinent labs & imaging results that were available during my care of the patient were reviewed by me and considered in my medical decision making (see chart for details).  Patient presents to the emergency department with cramping lower abdominal pain.  No significant tenderness on exam.  No dysuria.  She is also concerned about a lesion in the vagina which is nonpainful.  Labs ordered from triage and are pending.  UA pending.  Plan for pelvic exam to rule out PID, TOA. No concern for acute appendicitis.   Labs unremarkable. No UTI. BV noted. Will treat with this and have the patient f/u with PCP.   At this time, I do not feel there is any life-threatening condition present. I have reviewed and discussed all results (EKG, imaging, lab, urine as appropriate), exam findings with patient. I have reviewed nursing notes and appropriate previous records.  I feel the patient is safe to be discharged home without  further emergent workup. Discussed usual and customary return precautions. Patient and family (if present) verbalize understanding and are comfortable with this plan.  Patient will follow-up with their primary care provider. If they do not have a primary care provider, information for follow-up has been provided to them. All questions have been answered.  ____________________________________________  FINAL CLINICAL IMPRESSION(S) / ED DIAGNOSES  Final diagnoses:  Lower abdominal pain  Bacterial vaginosis    NEW OUTPATIENT MEDICATIONS STARTED DURING THIS VISIT:  Discharge Medication List as of 08/28/2017  5:35 PM    START taking these medications   Details  fluconazole (DIFLUCAN) 200 MG tablet Take 1 tablet (200 mg total) by mouth daily for 1 day., Starting Thu 08/28/2017, Until Fri 08/29/2017, Print    metroNIDAZOLE (FLAGYL) 500 MG tablet Take 1 tablet (500 mg total) by mouth 2 (two) times daily for 7 days., Starting Thu 08/28/2017, Until Thu 09/04/2017, Print        Note:  This document was prepared using Dragon voice recognition software and may  include unintentional dictation errors.  Alona Bene, MD Emergency Medicine    Areon Cocuzza, Arlyss Repress, MD 08/29/17 [phone]

## 2017-08-28 NOTE — ED Triage Notes (Signed)
Patient c/o lower abdominal pain x 2 days. Patient denies any N/V. Patient denies any dysuria, urinary frequency, or vaginal discharge. Patient also c/o "white vaginal bump". Patient states, "It's like when you peel your skin back and you have a white bump."

## 2017-08-28 NOTE — Discharge Instructions (Signed)
You have been seen in the Emergency Department (ED) for abdominal pain.  Your evaluation did not identify a clear cause of your symptoms but was generally reassuring.  You do have some evidence of a bacterial infection in the vagina. I am starting you on an antibiotic. Take as directed and DO NOT DRINK ALCOHOL while taking this medication as it can make you very sick. Take the Fluconazole if you develop a yeast infection. Take 1 tablet and then refill the medication if your yeast infection symptoms are not gone after two days.   Please follow up as instructed above regarding today?s emergent visit and the symptoms that are bothering you.  Return to the ED if your abdominal pain worsens or fails to improve, you develop bloody vomiting, bloody diarrhea, you are unable to tolerate fluids due to vomiting, fever greater than 101, or other symptoms that concern you.

## 2017-08-29 LAB — GC/CHLAMYDIA PROBE AMP (~~LOC~~) NOT AT ARMC
Chlamydia: NEGATIVE
Neisseria Gonorrhea: NEGATIVE

## 2017-09-22 ENCOUNTER — Other Ambulatory Visit: Payer: Self-pay

## 2017-09-22 ENCOUNTER — Emergency Department (HOSPITAL_COMMUNITY)
Admission: EM | Admit: 2017-09-22 | Discharge: 2017-09-22 | Disposition: A | Discharge status: Home / Self Care | Payer: Self-pay | Attending: Emergency Medicine | Admitting: Emergency Medicine

## 2017-09-22 ENCOUNTER — Encounter (HOSPITAL_COMMUNITY): Payer: Self-pay | Admitting: Emergency Medicine

## 2017-09-22 DIAGNOSIS — Z5321 Procedure and treatment not carried out due to patient leaving prior to being seen by health care provider: Secondary | ICD-10-CM | POA: Insufficient documentation

## 2017-09-22 DIAGNOSIS — R109 Unspecified abdominal pain: Secondary | ICD-10-CM | POA: Insufficient documentation

## 2017-09-22 LAB — COMPREHENSIVE METABOLIC PANEL
ALT: 14 U/L (ref 0–44)
AST: 15 U/L (ref 15–41)
Albumin: 3.6 g/dL (ref 3.5–5.0)
Alkaline Phosphatase: 68 U/L (ref 38–126)
Anion gap: 10 (ref 5–15)
BUN: 5 mg/dL — ABNORMAL LOW (ref 6–20)
CO2: 27 mmol/L (ref 22–32)
Calcium: 9 mg/dL (ref 8.9–10.3)
Chloride: 107 mmol/L (ref 98–111)
Creatinine, Ser: 0.67 mg/dL (ref 0.44–1.00)
GFR calc Af Amer: >60 mL/min (ref >60)
GFR calc non Af Amer: >60 mL/min (ref >60)
Glucose, Bld: 88 mg/dL (ref 70–99)
Potassium: 3.6 mmol/L (ref 3.5–5.1)
Sodium: 144 mmol/L (ref 135–145)
Total Bilirubin: 0.7 mg/dL (ref 0.3–1.2)
Total Protein: 7.4 g/dL (ref 6.5–8.1)

## 2017-09-22 LAB — I-STAT BETA HCG BLOOD, ED (MC, WL, AP ONLY): I-stat hCG, quantitative: <5 m[IU]/mL (ref <5)

## 2017-09-22 LAB — LIPASE, BLOOD: Lipase: 24 U/L (ref 11–51)

## 2017-09-22 NOTE — ED Triage Notes (Signed)
Pt complaint of lower abdominal pain for a week; denies n/v/d, vaginal bleeding/discharge, or GU symptoms. Continued to verbalizes left ear pain.

## 2017-09-22 NOTE — ED Notes (Signed)
Pt sts that the last time she was here, she was stuck 5 times and it became a "bad situation" so pt sts she would rather not have to be stuck again unless absolutely necessary.  RN notified.

## 2017-09-22 NOTE — ED Notes (Signed)
Triage Phlebotomy notified that the CBC-purple top clotted.

## 2017-09-23 NOTE — ED Notes (Signed)
Follow up call made  Pt hung up on caller  09/23/17 1135  s Latitia Housewright rn

## 2017-09-28 ENCOUNTER — Emergency Department (HOSPITAL_COMMUNITY)
Admission: EM | Admit: 2017-09-28 | Discharge: 2017-09-28 | Discharge status: Against Medical Advice | Payer: Self-pay | Attending: Emergency Medicine | Admitting: Emergency Medicine

## 2017-09-28 ENCOUNTER — Other Ambulatory Visit: Payer: Self-pay

## 2017-09-28 ENCOUNTER — Emergency Department (HOSPITAL_COMMUNITY): Payer: Self-pay

## 2017-09-28 DIAGNOSIS — R0789 Other chest pain: Secondary | ICD-10-CM | POA: Insufficient documentation

## 2017-09-28 DIAGNOSIS — J45909 Unspecified asthma, uncomplicated: Secondary | ICD-10-CM | POA: Insufficient documentation

## 2017-09-28 DIAGNOSIS — Z87891 Personal history of nicotine dependence: Secondary | ICD-10-CM | POA: Insufficient documentation

## 2017-09-28 DIAGNOSIS — R0781 Pleurodynia: Secondary | ICD-10-CM

## 2017-09-28 DIAGNOSIS — R0600 Dyspnea, unspecified: Secondary | ICD-10-CM | POA: Insufficient documentation

## 2017-09-28 LAB — I-STAT TROPONIN, ED
Troponin i, poc: 0 ng/mL (ref 0.00–0.08)
Troponin i, poc: 0 ng/mL (ref 0.00–0.08)

## 2017-09-28 LAB — BASIC METABOLIC PANEL
Anion gap: 8 (ref 5–15)
BUN: <5 mg/dL — ABNORMAL LOW (ref 6–20)
CO2: 25 mmol/L (ref 22–32)
Calcium: 8.7 mg/dL — ABNORMAL LOW (ref 8.9–10.3)
Chloride: 106 mmol/L (ref 98–111)
Creatinine, Ser: 0.73 mg/dL (ref 0.44–1.00)
GFR calc Af Amer: >60 mL/min (ref >60)
GFR calc non Af Amer: >60 mL/min (ref >60)
Glucose, Bld: 95 mg/dL (ref 70–99)
Potassium: 3.4 mmol/L — ABNORMAL LOW (ref 3.5–5.1)
Sodium: 139 mmol/L (ref 135–145)

## 2017-09-28 LAB — CBC
HCT: 37.2 % (ref 36.0–46.0)
Hemoglobin: 11.6 g/dL — ABNORMAL LOW (ref 12.0–15.0)
MCH: 28.5 pg (ref 26.0–34.0)
MCHC: 31.2 g/dL (ref 30.0–36.0)
MCV: 91.4 fL (ref 78.0–100.0)
Platelets: 260 10*3/uL (ref 150–400)
RBC: 4.07 MIL/uL (ref 3.87–5.11)
RDW: 14.8 % (ref 11.5–15.5)
WBC: 7.2 10*3/uL (ref 4.0–10.5)

## 2017-09-28 LAB — I-STAT BETA HCG BLOOD, ED (MC, WL, AP ONLY): I-stat hCG, quantitative: <5 m[IU]/mL (ref <5)

## 2017-09-28 MED ORDER — IBUPROFEN 800 MG PO TABS
800.0000 mg | ORAL_TABLET | Freq: Once | ORAL | Status: AC
  Administered 2017-09-28: 800 mg via ORAL
  Filled 2017-09-28: qty 1

## 2017-09-28 MED ORDER — IPRATROPIUM-ALBUTEROL 0.5-2.5 (3) MG/3ML IN SOLN
3.0000 mL | Freq: Once | RESPIRATORY_TRACT | Status: AC
  Administered 2017-09-28: 3 mL via RESPIRATORY_TRACT
  Filled 2017-09-28: qty 3

## 2017-09-28 NOTE — ED Triage Notes (Signed)
Patient states that CP that woke her up. States that she has had CP before. "Feels like somebody is beating the crap out of my heart".

## 2017-09-28 NOTE — ED Notes (Signed)
Patient leaving AMA

## 2017-09-28 NOTE — ED Provider Notes (Signed)
[facility] EMERGENCY DEPARTMENT Provider Note   CSN: [id number] Arrival date & time: 09/28/17  0307     History   Chief Complaint Chief Complaint  Patient presents with  . Chest Pain    HPI Michelle Lucas is a 27 y.o. female.  The history is provided by the patient.  She has history of asthma and pseudoseizures and comes in with left-sided chest pain.  Pain woke her up from sleep.  She describes a sharp pain which is worse with deep breath.  Pain was initially rated 10/10.  Now, it is only present if she takes a deep breath.  There was some dyspnea but no nausea or diaphoresis.  Denies any cough.  She denies fever or chills.  She had no pain prior to going to sleep, but had some mild nausea at that point.  She did use her albuterol inhaler which did give some partial relief.  She does not recall similar episodes in the past.  She is a non-smoker and denies history of recent travel or surgery.  She has no history of malignancy.  She has no history of DVT or pulmonary embolism.  She is not on any exogenous hormones.  Past Medical History:  Diagnosis Date  . Anxiety attack    does not take meds  . Asthma    PRN inhaler   . Hx of gonorrhea 05/2017  . Pseudoseizures     Patient Active Problem List   Diagnosis Date Noted  . 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   OB History    Gravida  0   Para  0   Term  0   Preterm  0   AB  0   Living  0     SAB  0   TAB  0   Ectopic  0   Multiple  0   Live Births  0            Home Medications    Prior to Admission medications   Medication Sig Start Date End Date Taking? Authorizing Provider  metoCLOPramide (REGLAN) 10 MG tablet Take 1 tablet (10 mg total) by mouth every 6 (six) hours. Patient not taking: Reported on 08/28/2017 08/02/17   Judeth Horn, NP    Family History Family History  Problem Relation Age of Onset  . Depression  Mother   . Hypertension Mother   . Depression Father   . Hypertension Father   . Depression Sister   . Depression Brother   . Hypertension Maternal Aunt   . COPD Maternal Uncle   . Hypertension Maternal Uncle   . Hypertension Maternal Grandmother   . Hypertension Maternal Grandfather   . Asthma Paternal Grandmother     Social History Social History   Tobacco Use  . Smoking status: Former Smoker    Types: Cigarettes    Last attempt to quit: 03/19/2015    Years since quitting: 2.5  . Smokeless tobacco: Never Used  Substance Use Topics  . Alcohol use: Not Currently  . Drug use: Not Currently    Types: Marijuana    Comment: every once in a while - 5-6 years ago      Allergies   Penicillins and Shrimp [shellfish allergy]   Review of Systems Review of Systems  All other systems reviewed and are negative.    Physical Exam Updated Vital Signs BP 116/75 (BP Location: Left Arm)  Pulse 84   Temp 98 F (36.7 C) (Oral)   Resp 20   LMP 09/24/2017 (Approximate)   SpO2 100%   Physical Exam  Nursing note and vitals reviewed.  27 year old female, resting comfortably and in no acute distress. Vital signs are normal. Oxygen saturation is 100%, which is normal. Head is normocephalic and atraumatic. PERRLA, EOMI. Oropharynx is clear. Neck is nontender and supple without adenopathy or JVD. Back is nontender and there is no CVA tenderness. Lungs are clear without rales, wheezes, or rhonchi. Chest is moderately tender in the left anterior chest wall.  This does reproduce her pain. Heart has regular rate and rhythm without murmur. Abdomen is soft, flat, nontender without masses or hepatosplenomegaly and peristalsis is normoactive. Extremities have no cyanosis or edema, full range of motion is present. Skin is warm and dry without rash. Neurologic: Mental status is normal, cranial nerves are intact, there are no motor or sensory deficits.  ED Treatments / Results  Labs (all  labs ordered are listed, but only abnormal results are displayed) Labs Reviewed  BASIC METABOLIC PANEL - Abnormal; Notable for the following components:      Result Value   Potassium 3.4 (*)    BUN <5 (*)    Calcium 8.7 (*)    All other components within normal limits  CBC - Abnormal; Notable for the following components:   Hemoglobin 11.6 (*)    All other components within normal limits  I-STAT TROPONIN, ED  I-STAT BETA HCG BLOOD, ED ([facility], [facility], [facility] ONLY)  I-STAT TROPONIN, ED    EKG EKG Interpretation  Date/Time:  Sunday September 28 2017 03:15:44 EDT Ventricular Rate:  80 PR Interval:  134 QRS Duration: 72 QT Interval:  352 QTC Calculation: 405 R Axis:   77 Text Interpretation:  Normal sinus rhythm with sinus arrhythmia T wave abnormality, consider inferior ischemia T wave abnormality, consider anterior ischemia Abnormal ECG When compared with ECG of 05/27/2015, T wave abnormality Inferior leads is now present T wave abnormality Anterior leads is slightly more pronounced Confirmed by Dione BoozeGlick, Annjeanette Sarwar ([id number]) on 09/28/2017 5:48:28 AM   Radiology Dg Chest 2 View  Result Date: 09/28/2017 CLINICAL DATA:  27 year old female with chest pain. EXAM: CHEST - 2 VIEW COMPARISON:  None. FINDINGS: The heart size and mediastinal contours are within normal limits. Both lungs are clear. The visualized skeletal structures are unremarkable. IMPRESSION: No active cardiopulmonary disease. Electronically Signed   By: Elgie CollardArash  Radparvar M.D.   On: 09/28/2017 04:05    Procedures Procedures  Medications Ordered in ED Medications  ibuprofen (ADVIL,MOTRIN) tablet 800 mg (800 mg Oral Given 09/28/17 0630)  ipratropium-albuterol (DUONEB) 0.5-2.5 (3) MG/3ML nebulizer solution 3 mL (3 mLs Nebulization Given 09/28/17 0630)     Initial Impression / Assessment and Plan / ED Course  I have reviewed the triage vital signs and the nursing notes.  Pertinent labs & imaging results that were available during my care of the  patient were reviewed by me and considered in my medical decision making (see chart for details).  Chest pain which appears to be chest wall pain by history.  I do not hear definite evidence of bronchospasm, but given incomplete response to albuterol inhaler, will try nebulizer treatment with albuterol and ipratropium.  Old records are reviewed, and she has no relevant past visits.  She is negative for pulmonary embolism via Wells criteria and PERC criteria, so no indication for d-dimer testing or CT angiogram.  Chest x-ray shows no  evidence of pneumonia.  ECG does show some new T wave changes compared with ECG from 2 years ago.  Initial troponin is normal, will check repeat troponin.  Heart score is 1 which puts her at extremely low risk for major adverse cardiac events in the next 6 weeks.  Patient left the following nebulizer treatment.  Repeat troponin had not been obtained.  Final Clinical Impressions(s) / ED Diagnoses   Final diagnoses:  Pleuritic chest pain    ED Discharge Orders    None       Dione Booze, MD 09/28/17 [phone]

## 2019-04-06 DIAGNOSIS — L732 Hidradenitis suppurativa: Secondary | ICD-10-CM | POA: Insufficient documentation

## 2019-07-23 ENCOUNTER — Emergency Department
Admission: EM | Admit: 2019-07-23 | Discharge: 2019-07-23 | Disposition: A | Discharge status: Home / Self Care | Payer: Self-pay | Attending: Emergency Medicine | Admitting: Emergency Medicine

## 2019-07-23 ENCOUNTER — Encounter: Payer: Self-pay | Admitting: Emergency Medicine

## 2019-07-23 DIAGNOSIS — Z5321 Procedure and treatment not carried out due to patient leaving prior to being seen by health care provider: Secondary | ICD-10-CM | POA: Insufficient documentation

## 2019-07-23 DIAGNOSIS — R109 Unspecified abdominal pain: Secondary | ICD-10-CM | POA: Insufficient documentation

## 2019-07-23 LAB — URINALYSIS, COMPLETE (UACMP) WITH MICROSCOPIC
Bacteria, UA: NONE SEEN
Bilirubin Urine: NEGATIVE
Glucose, UA: NEGATIVE mg/dL
Hgb urine dipstick: NEGATIVE
Ketones, ur: NEGATIVE mg/dL
Leukocytes,Ua: NEGATIVE
Nitrite: NEGATIVE
Protein, ur: NEGATIVE mg/dL
Specific Gravity, Urine: 1.008 (ref 1.005–1.030)
pH: 8 (ref 5.0–8.0)

## 2019-07-23 LAB — COMPREHENSIVE METABOLIC PANEL
ALT: 13 U/L (ref 0–44)
AST: 13 U/L — ABNORMAL LOW (ref 15–41)
Albumin: 3.6 g/dL (ref 3.5–5.0)
Alkaline Phosphatase: 88 U/L (ref 38–126)
Anion gap: 6 (ref 5–15)
BUN: 6 mg/dL (ref 6–20)
CO2: 25 mmol/L (ref 22–32)
Calcium: 9.1 mg/dL (ref 8.9–10.3)
Chloride: 106 mmol/L (ref 98–111)
Creatinine, Ser: 0.78 mg/dL (ref 0.44–1.00)
GFR calc Af Amer: >60 mL/min (ref >60)
GFR calc non Af Amer: >60 mL/min (ref >60)
Glucose, Bld: 90 mg/dL (ref 70–99)
Potassium: 3.7 mmol/L (ref 3.5–5.1)
Sodium: 137 mmol/L (ref 135–145)
Total Bilirubin: 0.7 mg/dL (ref 0.3–1.2)
Total Protein: 8.2 g/dL — ABNORMAL HIGH (ref 6.5–8.1)

## 2019-07-23 LAB — LIPASE, BLOOD: Lipase: 25 U/L (ref 11–51)

## 2019-07-23 LAB — CBC
HCT: 37.4 % (ref 36.0–46.0)
Hemoglobin: 11.9 g/dL — ABNORMAL LOW (ref 12.0–15.0)
MCH: 28 pg (ref 26.0–34.0)
MCHC: 31.8 g/dL (ref 30.0–36.0)
MCV: 88 fL (ref 80.0–100.0)
Platelets: 295 10*3/uL (ref 150–400)
RBC: 4.25 MIL/uL (ref 3.87–5.11)
RDW: 14.5 % (ref 11.5–15.5)
WBC: 5.9 10*3/uL (ref 4.0–10.5)
nRBC: 0 % (ref 0.0–0.2)

## 2019-07-23 LAB — POCT PREGNANCY, URINE: Preg Test, Ur: NEGATIVE

## 2019-07-23 MED ORDER — SODIUM CHLORIDE 0.9% FLUSH: 3.0000 mL | Freq: Once | INTRAVENOUS | Status: DC

## 2019-07-23 NOTE — ED Notes (Signed)
Dr Zadie Cleverly pt, she has left ED.  Does not wish to return to see MD

## 2019-07-23 NOTE — ED Triage Notes (Signed)
Pt to ED with c/o of lower abdominal pain. Pt states her menstrual cycle is late by 7 days and her last cycle was abnormal.

## 2019-07-23 NOTE — ED Notes (Signed)
First nurse note- pt here for abd pain. Ambulatory, NAD

## 2019-07-23 NOTE — ED Triage Notes (Signed)
Pt called for room no answer

## 2019-07-26 ENCOUNTER — Telehealth: Payer: Self-pay | Admitting: Emergency Medicine

## 2019-07-26 NOTE — Telephone Encounter (Signed)
Called patient due to lwot to inquire about condition and follow up plans. Left message.   

## 2019-07-28 ENCOUNTER — Ambulatory Visit: Payer: Self-pay | Admitting: Obstetrics and Gynecology

## 2019-07-28 NOTE — Progress Notes (Deleted)
PCP:  Jonnie Kind, MD   No chief complaint on file.    HPI:      [name] is a 29 y.o. G0P0000 whose LMP was No LMP recorded. (Menstrual status: Irregular Periods)., presents today for her NP annual examination.  Her menses are {norm/abn:715}, lasting {number:22536} days.  Dysmenorrhea {dysmen:716}. She {does:18564} have intermenstrual bleeding.  Sex activity: {sex active:315163}.  Last Pap: {XIPJ:825053976}  Results were: {norm/abn:16707::"no abnormalities"} /neg HPV DNA *** Hx of STDs: {STD hx:14358}  Last mammogram: {date:304500300}  Results were: {norm/abn:13465} There is no FH of breast cancer. There is no FH of ovarian cancer. The patient {does:18564} do self-breast exams.  Tobacco use: {tob:20664} Alcohol use: {Alcohol:11675} No drug use.  Exercise: {exercise:31265}  She {does:18564} get adequate calcium and Vitamin D in her diet.   Past Medical History:  Diagnosis Date  . Anxiety attack    does not take meds  . Asthma    PRN inhaler   . Heart disease   . Hx of gonorrhea 05/2017  . Major depression   . Pseudoseizures     No past surgical history on file.  Family History  Problem Relation Age of Onset  . Depression Mother   . Hypertension Mother   . Depression Father   . Hypertension Father   . Depression Sister   . Depression Brother   . Hypertension Maternal Aunt   . COPD Maternal Uncle   . Hypertension Maternal Uncle   . Hypertension Maternal Grandmother   . Breast cancer Maternal Grandmother   . Hypertension Maternal Grandfather   . Lung cancer Maternal Grandfather        Family history  . Heart disease Maternal Grandfather   . Asthma Paternal Grandmother     Social History   Socioeconomic History  . Marital status: Single    Spouse name: Not on file  . Number of children: Not on file  . Years of education: Not on file  . Highest education level: Not on file  Occupational History  . Not on file  Tobacco Use  . Smoking  status: Former Smoker    Types: Cigarettes    Quit date: 03/19/2015    Years since quitting: 4.3  . Smokeless tobacco: Never Used  Substance and Sexual Activity  . Alcohol use: Not Currently  . Drug use: Not Currently    Types: Marijuana    Comment: every once in a while - 5-6 years ago   . Sexual activity: Yes    Birth control/protection: None  Other Topics Concern  . Not on file  Social History Narrative  . Not on file   Social Determinants of Health   Financial Resource Strain:   . Difficulty of Paying Living Expenses:   Food Insecurity:   . Worried About Charity fundraiser in the Last Year:   . Arboriculturist in the Last Year:   Transportation Needs:   . Film/video editor (Medical):   Marland Kitchen Lack of Transportation (Non-Medical):   Physical Activity:   . Days of Exercise per Week:   . Minutes of Exercise per Session:   Stress:   . Feeling of Stress :   Social Connections:   . Frequency of Communication with Friends and Family:   . Frequency of Social Gatherings with Friends and Family:   . Attends Religious Services:   . Active Member of Clubs or Organizations:   . Attends Archivist Meetings:   .  Marital Status:   Intimate Partner Violence:   . Fear of Current or Ex-Partner:   . Emotionally Abused:   Marland Kitchen Physically Abused:   . Sexually Abused:     No current outpatient medications on file.     ROS:  Review of Systems BREAST: No symptoms   Objective: There were no vitals taken for this visit.   OBGyn Exam  Results: No results found for this or any previous visit (from the past 24 hour(s)).  Assessment/Plan: No diagnosis found.  No orders of the defined types were placed in this encounter.            GYN counsel {counseling:16159}     F/U  No follow-ups on file.  Valiant Dills B. Ramla Hase, PA-C 07/28/2019 9:17 AM

## 2019-12-16 ENCOUNTER — Other Ambulatory Visit: Payer: Self-pay

## 2019-12-16 ENCOUNTER — Encounter: Payer: Self-pay | Admitting: Physician Assistant

## 2019-12-16 ENCOUNTER — Ambulatory Visit (LOCAL_COMMUNITY_HEALTH_CENTER): Payer: Self-pay | Admitting: Physician Assistant

## 2019-12-16 VITALS — BP 129/80 | Ht 66.0 in | Wt 212.0 lb

## 2019-12-16 DIAGNOSIS — Z01419 Encounter for gynecological examination (general) (routine) without abnormal findings: Secondary | ICD-10-CM

## 2019-12-16 DIAGNOSIS — F419 Anxiety disorder, unspecified: Secondary | ICD-10-CM

## 2019-12-16 DIAGNOSIS — Z3009 Encounter for other general counseling and advice on contraception: Secondary | ICD-10-CM

## 2019-12-16 DIAGNOSIS — Z30013 Encounter for initial prescription of injectable contraceptive: Secondary | ICD-10-CM

## 2019-12-16 DIAGNOSIS — Z113 Encounter for screening for infections with a predominantly sexual mode of transmission: Secondary | ICD-10-CM

## 2019-12-16 LAB — WET PREP FOR TRICH, YEAST, CLUE
Trichomonas Exam: NEGATIVE
Yeast Exam: NEGATIVE

## 2019-12-16 MED ORDER — MEDROXYPROGESTERONE ACETATE 150 MG/ML IM SUSP
150.0000 mg | INTRAMUSCULAR | Status: AC
  Administered 2019-12-16: 150 mg via INTRAMUSCULAR

## 2019-12-16 NOTE — Progress Notes (Signed)
Patient here for physical and desires depo today. Patient currently only has female partners. RN educated patient on using latex free dental dam. Also wants STD testing, concerned that partner has sex with other people.   Harvie Heck, RN  Post:  RN reviewed wetmount with patient. No treatment needed per S.O. Depo 150 mg admin in right arm. Reminder card for next depo due date given. Non latex condoms given.   Harvie Heck, RN

## 2019-12-16 NOTE — Progress Notes (Signed)
[facility] DEPARTMENT [facility] [address] Main Number: [phone]    Family Planning Visit- Initial Visit  Subjective:  Michelle Lucas is a 29 y.o.  G1P0101   being seen today for an initial well woman visit and to discuss family planning options.  She is currently using None for pregnancy prevention. Patient reports she does not want a pregnancy in the next year.  Patient has the following medical conditions has Adjustment disorder with disturbance of conduct and Hidradenitis suppurativa on their problem list.  Chief Complaint  Patient presents with  . Contraception    physical and depo    Patient reports that she would like to start Depo for help with her periods.  States that when she was on Depo in the past she did not have periods and that she has been having heavy bleeding with clotting lately.  Reports a history of seizures, migraine headaches, bipolar disorder, major depressive disorder and anxiety.  Migraine symptoms are "stabbling" pain in the head and the right side of her body "shuts down" with loss of feeling and weakness.  Reports rest resolves her migraines.  States that she is not taking any medication currently for seizures.  Requests referral to LCSW for evaluation.  PHQ-9=22; denies suicidal or homicidal ideation or plan.  Requests STD screening today.   Patient denies any other concerns today.    Body mass index is 34.22 kg/m. - Patient is eligible for diabetes screening based on BMI and age >37?  not applicable HA1C ordered? not applicable  Patient reports 1 partner in last year. Desires STI screening?  Yes  Has patient been screened once for HCV in the past?  No  No results found for: HCVAB  Does the patient have current drug use (including MJ), have a partner with drug use, and/or has been incarcerated since last result? No  If yes-- Screen for HCV through [facility]   Does the patient meet criteria for HBV  testing? No  Criteria:  -Household, sexual or needle sharing contact with HBV -History of drug use -HIV positive -Those with known Hep C   Health Maintenance Due  Topic Date Due  . Hepatitis C Screening  Never done  . COVID-19 Vaccine (1) Never done  . HIV Screening  Never done  . TETANUS/TDAP  Never done  . PAP-Cervical Cytology Screening  Never done  . PAP SMEAR-Modifier  Never done  . INFLUENZA VACCINE  Never done    Review of Systems  All other systems reviewed and are negative.   The following portions of the patient's history were reviewed and updated as appropriate: allergies, current medications, past family history, past medical history, past social history, past surgical history and problem list. Problem list updated.   See flowsheet for other program required questions.  Objective:   Vitals:   12/16/19 0910  BP: 129/80  Weight: 212 lb (96.2 kg)  Height: 5\' 6"  (1.676 m)    Physical Exam Vitals and nursing note reviewed.  Constitutional:      General: She is not in acute distress.    Appearance: Normal appearance.  HENT:     Head: Normocephalic and atraumatic.     Mouth/Throat:     Mouth: Mucous membranes are moist.     Pharynx: Oropharynx is clear. No oropharyngeal exudate or posterior oropharyngeal erythema.  Eyes:     Conjunctiva/sclera: Conjunctivae normal.  Neck:     Thyroid: No thyroid mass, thyromegaly  or thyroid tenderness.  Cardiovascular:     Rate and Rhythm: Normal rate and regular rhythm.  Pulmonary:     Effort: Pulmonary effort is normal.     Breath sounds: Normal breath sounds.  Chest:     Breasts:        Right: Normal. No mass, nipple discharge, skin change or tenderness.        Left: Normal. No mass, nipple discharge, skin change or tenderness.  Abdominal:     Palpations: Abdomen is soft. There is no mass.     Tenderness: There is no abdominal tenderness. There is no guarding or rebound.  Genitourinary:    General: Normal  vulva.     Rectum: Normal.     Comments: External genitalia/pubic area without nits, lice, edema, erythema, lesions and inguinal adenopathy. Vagina with normal mucosa and discharge. Cervix without visible lesions. Uterus firm, mobile, nt, no masses, no CMT, no adnexal tenderness or fullness. Musculoskeletal:     Cervical back: Neck supple. No tenderness.  Lymphadenopathy:     Cervical: No cervical adenopathy.     Upper Body:     Right upper body: No supraclavicular or axillary adenopathy.     Left upper body: No supraclavicular, axillary or pectoral adenopathy.  Skin:    General: Skin is warm and dry.     Findings: No bruising, erythema, lesion or rash.  Neurological:     Mental Status: She is alert and oriented to person, place, and time.  Psychiatric:        Mood and Affect: Mood normal.        Behavior: Behavior normal.        Thought Content: Thought content normal.        Judgment: Judgment normal.       Assessment and Plan:  Michelle Lucas is a 28 y.o. female presenting to the [facility] Department for an initial well woman exam/family planning visit  Contraception counseling: Reviewed all forms of birth control options in the tiered based approach. available including abstinence; over the counter/barrier methods; hormonal contraceptive medication including pill, patch, ring, injection,contraceptive implant, ECP; hormonal and nonhormonal IUDs; permanent sterilization options including vasectomy and the various tubal sterilization modalities. Risks, benefits, and typical effectiveness rates were reviewed.  Questions were answered.  Written information was also given to the patient to review.  Patient desires Depo, this was prescribed for patient. She will follow up in  3 months and prn for surveillance.  She was told to call with any further questions, or with any concerns about this method of contraception.  Emphasized use of condoms 100% of the time for STI  prevention.  Patient was not a candidate for ECP today.  1. Encounter for counseling regarding contraception Reviewed with patient SE of Depo and when to call clinic for irregular bleeding. Enc condoms/dental dams with all sex for STD protection.  2. Screening for STD (sexually transmitted disease) Await test results.  Counseled that RN will call if needs to RTC for treatment once results are back.  - WET PREP FOR TRICH, YEAST, CLUE - Chlamydia/Gonorrhea [facility] - HIV/HCV [place] [facility] - Syphilis Serology, [facility]  3. Well woman exam with routine gynecological exam Reviewed with patient healthy habits to maintain general health. Enc MVI 1 po daily. Enc to establish with/ follow up with PCP for primary care concerns, age appropriate screenings and illness. Await test results.  Counseled that RN will call once results are back  with follow up recommendations. - IGP, rfx Aptima HPV ASCU  4. Initiation of Depo Provera OK to start Depo 150 mg IM q 11-13 weeks for 1 year. - medroxyPROGESTERone (DEPO-PROVERA) injection 150 mg  5. Anxiety Referral made to LCSW per patient request. LCSW and [facility] cards to be given to patient. - Ambulatory referral to Behavioral Health     Return in about 11 weeks (around 03/02/2020) for Depo due date.  No future appointments.  Matt Holmes, PA

## 2019-12-20 ENCOUNTER — Emergency Department
Admission: EM | Admit: 2019-12-20 | Discharge: 2019-12-20 | Disposition: A | Discharge status: Home / Self Care | Payer: Self-pay | Attending: Emergency Medicine | Admitting: Emergency Medicine

## 2019-12-20 ENCOUNTER — Other Ambulatory Visit: Payer: Self-pay

## 2019-12-20 DIAGNOSIS — R569 Unspecified convulsions: Secondary | ICD-10-CM | POA: Insufficient documentation

## 2019-12-20 DIAGNOSIS — Z87891 Personal history of nicotine dependence: Secondary | ICD-10-CM | POA: Insufficient documentation

## 2019-12-20 DIAGNOSIS — Z91040 Latex allergy status: Secondary | ICD-10-CM | POA: Insufficient documentation

## 2019-12-20 DIAGNOSIS — J45909 Unspecified asthma, uncomplicated: Secondary | ICD-10-CM | POA: Insufficient documentation

## 2019-12-20 LAB — URINALYSIS, ROUTINE W REFLEX MICROSCOPIC
Bilirubin Urine: NEGATIVE
Glucose, UA: NEGATIVE mg/dL
Hgb urine dipstick: NEGATIVE
Ketones, ur: NEGATIVE mg/dL
Leukocytes,Ua: NEGATIVE
Nitrite: NEGATIVE
Protein, ur: NEGATIVE mg/dL
Specific Gravity, Urine: 1.006 (ref 1.005–1.030)
pH: 6 (ref 5.0–8.0)

## 2019-12-20 LAB — CBC WITH DIFFERENTIAL/PLATELET
Abs Immature Granulocytes: 0.02 10*3/uL (ref 0.00–0.07)
Basophils Absolute: 0 10*3/uL (ref 0.0–0.1)
Basophils Relative: 1 %
Eosinophils Absolute: 0.2 10*3/uL (ref 0.0–0.5)
Eosinophils Relative: 3 %
HCT: 35.1 % — ABNORMAL LOW (ref 36.0–46.0)
Hemoglobin: 11.2 g/dL — ABNORMAL LOW (ref 12.0–15.0)
Immature Granulocytes: 0 %
Lymphocytes Relative: 37 %
Lymphs Abs: 2.2 10*3/uL (ref 0.7–4.0)
MCH: 27.5 pg (ref 26.0–34.0)
MCHC: 31.9 g/dL (ref 30.0–36.0)
MCV: 86.2 fL (ref 80.0–100.0)
Monocytes Absolute: 0.5 10*3/uL (ref 0.1–1.0)
Monocytes Relative: 8 %
Neutro Abs: 3.1 10*3/uL (ref 1.7–7.7)
Neutrophils Relative %: 51 %
Platelets: 297 10*3/uL (ref 150–400)
RBC: 4.07 MIL/uL (ref 3.87–5.11)
RDW: 15.2 % (ref 11.5–15.5)
WBC: 6 10*3/uL (ref 4.0–10.5)
nRBC: 0 % (ref 0.0–0.2)

## 2019-12-20 LAB — COMPREHENSIVE METABOLIC PANEL
ALT: 18 U/L (ref 0–44)
AST: 18 U/L (ref 15–41)
Albumin: 3.5 g/dL (ref 3.5–5.0)
Alkaline Phosphatase: 67 U/L (ref 38–126)
Anion gap: 6 (ref 5–15)
BUN: 10 mg/dL (ref 6–20)
CO2: 27 mmol/L (ref 22–32)
Calcium: 8.7 mg/dL — ABNORMAL LOW (ref 8.9–10.3)
Chloride: 107 mmol/L (ref 98–111)
Creatinine, Ser: 0.7 mg/dL (ref 0.44–1.00)
GFR calc Af Amer: >60 mL/min (ref >60)
GFR calc non Af Amer: >60 mL/min (ref >60)
Glucose, Bld: 79 mg/dL (ref 70–99)
Potassium: 3.8 mmol/L (ref 3.5–5.1)
Sodium: 140 mmol/L (ref 135–145)
Total Bilirubin: 0.4 mg/dL (ref 0.3–1.2)
Total Protein: 7.6 g/dL (ref 6.5–8.1)

## 2019-12-20 LAB — POCT PREGNANCY, URINE: Preg Test, Ur: NEGATIVE

## 2019-12-20 LAB — ETHANOL: Alcohol, Ethyl (B): <10 mg/dL (ref <10)

## 2019-12-20 LAB — IGP, RFX APTIMA HPV ASCU: PAP Smear Comment: 0

## 2019-12-20 LAB — MAGNESIUM: Magnesium: 1.8 mg/dL (ref 1.7–2.4)

## 2019-12-20 LAB — ACETAMINOPHEN LEVEL: Acetaminophen (Tylenol), Serum: <10 ug/mL — ABNORMAL LOW (ref 10–30)

## 2019-12-20 LAB — LACTIC ACID, PLASMA: Lactic Acid, Venous: 1 mmol/L (ref 0.5–1.9)

## 2019-12-20 LAB — SALICYLATE LEVEL: Salicylate Lvl: <7 mg/dL — ABNORMAL LOW (ref 7.0–30.0)

## 2019-12-20 MED ORDER — LEVETIRACETAM IN NACL 1000 MG/100ML IV SOLN: 1000.0000 mg | Freq: Once | INTRAVENOUS | Status: DC

## 2019-12-20 NOTE — Discharge Instructions (Addendum)
You have been seen in the emergency department today for a seizure.  It is unclear whether your seizure-like activity is the result of epilepsy or non-epileptic seizures (sometimes known as "pseudoseizures").  Your workup today including labs are within normal limits.  Please follow up with your doctor/neurologist as soon as possible regarding today's emergency department visit and your likely seizure.  As we have discussed it is very important that you do not drive until you have been seen and cleared by your neurologist.  Please drink plenty of fluids, get plenty of sleep and avoid any alcohol or drug use.  Please return to the emergency department if you have any further seizures which do not respond to medications, or for any other symptoms that concern you.  Please remember that by law you are not allowed to drive with a seizure diagnosis until you are cleared by a neurologist.

## 2019-12-20 NOTE — ED Provider Notes (Signed)
[facility] Emergency Department Provider Note  ____________________________________________   First MD Initiated Contact with Patient 12/20/19 0119     (approximate)  I have reviewed the triage vital signs and the nursing notes.   HISTORY  Chief Complaint Seizures  Level 5 caveat:  history/ROS limited by acute/critical illness  HPI Michelle Lucas is a 29 y.o. female with a history that includes either seizures or pseudoseizures who presents by private vehicle from night with her girlfriend for evaluation of seizure-like activity.  The girlfriend provides most of the history because the patient is sleepy/postictal and will answer questions but is not particularly forthcoming with information.  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  The patient reported to the girlfriend that she was not feeling well and was having some trouble with her right arm.  The right arm felt cold and her hand was clasped in a fixed position.  She has had the symptoms for similar symptoms in the past associated with mild seizures.  When the symptoms continued and the patient was staring off into space, the girlfriend knew what was happening so she put her in the car and they started driving to the hospital.  Girlfriend reports that few minutes prior to arrival to the ED the patient had a "grand mal seizure" where her whole body was shaking and she was not responding.  She reports that "this time" the patient did not lose control of her bladder or bite her tongue or her cheeks.  The seizure activity stopped prior to arrival to the ED and lasted a short period of time.  The patient is currently sleepy but has no other complaints.  They report that this happens maybe once a month.  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  She said that she was told that seizure medicine would not help.   The patient has not seen a doctor about this since 2010.  The patient had a Depo shot a few days ago but otherwise has had no new medication changes.  She denies drugs and alcohol.  She has not been vaccinated for COVID-19 but she denies fever/chills, sore throat, chest pain, shortness of breath, nausea, vomiting, abdominal pain, and dysuria.  Symptoms in her right hand and arm have resolved.  Nothing in particular made the symptoms better or worse and they are relatively acute in onset and severe.         Past Medical History:  Diagnosis Date  . Anxiety attack    does not take meds  . Asthma    PRN inhaler   . Heart disease   . Hx of gonorrhea 05/2017  . Major depression   . Migraines   . 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 Hidradenitis suppurativa 04/06/2019  . 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 No pertinent surgical history.  Prior to Admission medications   Not on File    Allergies Ciprofloxacin, Latex, Penicillins, and Shrimp [shellfish allergy]  Family History  Problem Relation Age of Onset  . Depression Mother   . Hypertension Mother   . Depression Father   . Hypertension Father   . Depression Sister   . Depression Brother   . Hypertension Maternal Aunt   . COPD Maternal Uncle   .  Hypertension Maternal Uncle   . Hypertension Maternal Grandmother   . Breast cancer Maternal Grandmother   . Hypertension Maternal Grandfather   . Lung cancer Maternal Grandfather        Family history  . Heart disease Maternal Grandfather   . Asthma Paternal Grandmother     Social History Social History   Tobacco Use  . Smoking status: Former Smoker    Types: Cigarettes    Quit date: 03/19/2015    Years since quitting: 4.7  . Smokeless tobacco: Never Used  Vaping Use  . Vaping Use: Never used  Substance Use Topics  . Alcohol use: Not Currently  . Drug use: Not Currently     Types: Marijuana    Comment: every once in a while - 5-6 years ago     Review of Systems Level 5 caveat:  history/ROS limited by acute/critical illness  Constitutional: No fever/chills Eyes: No visual changes. ENT: No sore throat. Cardiovascular: Denies chest pain. Respiratory: Denies shortness of breath. Gastrointestinal: No abdominal pain.  No nausea, no vomiting.  No diarrhea.  No constipation. Genitourinary: Negative for dysuria. Musculoskeletal: Negative for neck pain.  Negative for back pain. Integumentary: Negative for rash. Neurological: Negative for headaches, focal weakness or numbness.   ____________________________________________   PHYSICAL EXAM:  VITAL SIGNS: ED Triage Vitals  Enc Vitals Group     BP 12/20/19 0115 125/81     Pulse Rate 12/20/19 0115 72     Resp 12/20/19 0115 19     Temp --      Temp src --      SpO2 12/20/19 0115 100 %     Weight 12/20/19 0117 98.4 kg (217 lb)     Height 12/20/19 0117 1.702 m (5\' 7" )     Head Circumference --      Peak Flow --      Pain Score 12/20/19 0116 0     Pain Loc --      Pain Edu? --      Excl. in GC? --     Constitutional: Alert and oriented.  Slow to respond but responds appropriately. Eyes: Conjunctivae are normal.  Pupils are equal and reactive bilaterally. Head: Atraumatic. Nose: No congestion/rhinnorhea. Mouth/Throat: Patient is wearing a mask. Neck: No stridor.  No meningeal signs.   Cardiovascular: Normal rate, regular rhythm. Good peripheral circulation. Grossly normal heart sounds. Respiratory: Normal respiratory effort.  No retractions. Gastrointestinal: Soft and nontender. No distention.  Musculoskeletal: No lower extremity tenderness nor edema. No gross deformities of extremities. Neurologic:  Normal speech and language. No gross focal neurologic deficits are appreciated.  Skin:  Skin is warm, dry and intact. Psychiatric: Mood and affect are flat and blunted but otherwise  appropriate.  ____________________________________________   LABS (all labs ordered are listed, but only abnormal results are displayed)  Labs Reviewed  CBC WITH DIFFERENTIAL/PLATELET - Abnormal; Notable for the following components:      Result Value   Hemoglobin 11.2 (*)    HCT 35.1 (*)    All other components within normal limits  COMPREHENSIVE METABOLIC PANEL - Abnormal; Notable for the following components:   Calcium 8.7 (*)    All other components within normal limits  SALICYLATE LEVEL - Abnormal; Notable for the following components:   Salicylate Lvl <7.0 (*)    All other components within normal limits  ACETAMINOPHEN LEVEL - Abnormal; Notable for the following components:   Acetaminophen (Tylenol), Serum <10 (*)    All other components within  normal limits  URINALYSIS, ROUTINE W REFLEX MICROSCOPIC - Abnormal; Notable for the following components:   Color, Urine STRAW (*)    APPearance HAZY (*)    All other components within normal limits  MAGNESIUM  ETHANOL  LACTIC ACID, PLASMA  POC URINE PREG, ED  POCT PREGNANCY, URINE  CBG MONITORING, ED   ____________________________________________  EKG  No indication for emergent EKG ____________________________________________  RADIOLOGY I, Loleta Roseory Licet Dunphy, personally viewed and evaluated these images (plain radiographs) as part of my medical decision making, as well as reviewing the written report by the radiologist.  ED MD interpretation: No indication for emergent imaging  Official radiology report(s): No results found.  ____________________________________________   PROCEDURES   Procedure(s) performed (including Critical Care):  .1-3 Lead EKG Interpretation Performed by: Loleta RoseForbach, Akash Winski, MD Authorized by: Loleta RoseForbach, Teyla Skidgel, MD     Interpretation: normal     ECG rate:  70   ECG rate assessment: normal     Rhythm: sinus rhythm     Ectopy: none     Conduction: normal        ____________________________________________   INITIAL IMPRESSION / MDM / ASSESSMENT AND PLAN / ED COURSE  As part of my medical decision making, I reviewed the following data within the electronic MEDICAL RECORD NUMBER History obtained from family, Nursing notes reviewed and incorporated, Labs reviewed , Old chart reviewed, Notes from prior ED visits and [place] Controlled Substance Database   Differential diagnosis includes, but is not limited to, seizure, pseudoseizure or nonepileptic seizure, metabolic or electrolyte abnormality, medication or drug side effect, toxidrome.  The patient's presentation currently is most consistent with post ictal state after seizure, but looking back through the medical record it seems that pseudoseizure is a more likely diagnosis.  The last emergency department visit I can find was in April 2020 (approximately a year and half ago) [facility].  She had a normal head CT and has had other head CTs in the past that have been normal.  Her vital signs are stable today and she has no symptoms.  I will evaluate with broad labs including for possible toxins or ingestions including ethanol but will hold off on additional imaging given that she is a young woman and I think that in the absence of any focal neurological deficits or trauma there is little benefit to obtaining a head CT.  The patient and the girlfriend are both very calm and appropriate.  Initially ordered a dose of Keppra but under the circumstances I will hold off.  I explained to the patient and the girlfriend that if her medical work-up is reassuring and we observe her for period of time and she feels back to normal she may benefit most from being referred to Dr. Malvin JohnsPotter for outpatient work-up.  They understand and agree with the plan.  The patient is on the cardiac monitor to evaluate for evidence of arrhythmia and/or significant heart rate changes.     Clinical Course as of Dec 20 426  Mon Dec 20, 2019  0216 Preg Test, Ur: NEGATIVE [CF]  0216 Normal UA  Urinalysis, Routine w reflex microscopic(!) [CF]  0216 Normal CBC  CBC WITH DIFFERENTIAL(!) [CF]  [id number] Labs all within normal limits including electrolytes, ETOH, co-injestions, etc.   [CF]  0303 Patient tolerating oral intake, seems to be back to baseline.   [CF]  0423 Patient has been sleeping but came fully awake and alert when I talk to her.  Her nurse, Elijah Birkom, felt  like her right hand was contracted earlier, but I did not appreciate that on my initial exam and I rechecked her now and her neurological exam is again completely normal.  She has normal use of both of her hands, good grip strength bilaterally, and she is ambulatory to and from the commode without any difficulty.  I went over the results of her blood work and urinalysis with the patient and her partner.  I explained that given the history and the need for outpatient follow-up, I think it would be better to avoid starting her on AED and wrist messing of the outpatient work-up.  She has been dealing with the situation for a while and she understands that she needs to follow-up but it is unclear if Keppra or another medication would play a role tonight.  She understands and agrees with the plan.  I gave my usual and customary seizure/return precautions.   [CF]    Clinical Course User Index [CF] Loleta Rose, MD     ____________________________________________  FINAL CLINICAL IMPRESSION(S) / ED DIAGNOSES  Final diagnoses:  Seizure-like activity (HCC)     MEDICATIONS GIVEN DURING THIS VISIT:  Medications - No data to display   ED Discharge Orders    None      *Please note:  Michelle Lucas was evaluated in Emergency Department on 12/20/2019 for the symptoms described in the history of present illness. She was evaluated in the context of the global COVID-19 pandemic, which necessitated consideration that the patient might be at risk for infection with the SARS-CoV-2  virus that causes COVID-19. Institutional protocols and algorithms that pertain to the evaluation of patients at risk for COVID-19 are in a state of rapid change based on information released by regulatory bodies including the CDC and federal and state organizations. These policies and algorithms were followed during the patient's care in the ED.  Some ED evaluations and interventions may be delayed as a result of limited staffing during and after the pandemic.*  Note:  This document was prepared using Dragon voice recognition software and may include unintentional dictation errors.   Loleta Rose, MD 12/20/19 [phone]

## 2019-12-20 NOTE — ED Triage Notes (Signed)
Pt arrives to ED from work via PoV driven by girlfriend with c/c of seizure like activity. Sx onset at approx 0015 and witnessed seizure at approx 1240 lasting for 3-4 minutes. Pt has Hx of seizures with last one reported at the end of August. Pt is not currently on any medications. Upon arrival, pt post ictal, able to whisper first name.

## 2019-12-28 ENCOUNTER — Encounter: Payer: Self-pay | Admitting: Family Medicine

## 2019-12-28 LAB — HM HEPATITIS C SCREENING LAB: HM Hepatitis Screen: NEGATIVE

## 2019-12-28 LAB — HM HIV SCREENING LAB: HM HIV Screening: NEGATIVE

## 2020-01-04 ENCOUNTER — Telehealth: Payer: Self-pay | Admitting: Licensed Clinical Social Worker

## 2020-01-04 NOTE — Telephone Encounter (Signed)
LCSW attempted to call patient to offer sooner appointment due to availability opening up.

## 2020-01-12 ENCOUNTER — Ambulatory Visit: Payer: Self-pay | Admitting: Licensed Clinical Social Worker

## 2020-01-22 DIAGNOSIS — R197 Diarrhea, unspecified: Secondary | ICD-10-CM | POA: Insufficient documentation

## 2020-01-22 DIAGNOSIS — Z20822 Contact with and (suspected) exposure to covid-19: Secondary | ICD-10-CM | POA: Insufficient documentation

## 2020-01-22 DIAGNOSIS — R109 Unspecified abdominal pain: Secondary | ICD-10-CM | POA: Insufficient documentation

## 2020-01-22 DIAGNOSIS — R112 Nausea with vomiting, unspecified: Secondary | ICD-10-CM | POA: Insufficient documentation

## 2020-01-22 DIAGNOSIS — Z87891 Personal history of nicotine dependence: Secondary | ICD-10-CM | POA: Insufficient documentation

## 2020-01-22 DIAGNOSIS — Z91040 Latex allergy status: Secondary | ICD-10-CM | POA: Insufficient documentation

## 2020-01-22 DIAGNOSIS — J45909 Unspecified asthma, uncomplicated: Secondary | ICD-10-CM | POA: Insufficient documentation

## 2020-01-23 ENCOUNTER — Other Ambulatory Visit: Payer: Self-pay

## 2020-01-23 ENCOUNTER — Emergency Department
Admission: EM | Admit: 2020-01-23 | Discharge: 2020-01-23 | Disposition: A | Discharge status: Home / Self Care | Payer: Self-pay | Attending: Emergency Medicine | Admitting: Emergency Medicine

## 2020-01-23 DIAGNOSIS — R112 Nausea with vomiting, unspecified: Secondary | ICD-10-CM

## 2020-01-23 LAB — RESP PANEL BY RT PCR (RSV, FLU A&B, COVID)
Influenza A by PCR: NEGATIVE
Influenza B by PCR: NEGATIVE
Respiratory Syncytial Virus by PCR: NEGATIVE
SARS Coronavirus 2 by RT PCR: NEGATIVE

## 2020-01-23 LAB — CBC
HCT: 36.6 % (ref 36.0–46.0)
Hemoglobin: 11.7 g/dL — ABNORMAL LOW (ref 12.0–15.0)
MCH: 27.8 pg (ref 26.0–34.0)
MCHC: 32 g/dL (ref 30.0–36.0)
MCV: 86.9 fL (ref 80.0–100.0)
Platelets: 276 10*3/uL (ref 150–400)
RBC: 4.21 MIL/uL (ref 3.87–5.11)
RDW: 14.5 % (ref 11.5–15.5)
WBC: 8.3 10*3/uL (ref 4.0–10.5)
nRBC: 0 % (ref 0.0–0.2)

## 2020-01-23 LAB — URINALYSIS, COMPLETE (UACMP) WITH MICROSCOPIC
Bacteria, UA: NONE SEEN
Bilirubin Urine: NEGATIVE
Glucose, UA: NEGATIVE mg/dL
Hgb urine dipstick: NEGATIVE
Ketones, ur: NEGATIVE mg/dL
Leukocytes,Ua: NEGATIVE
Nitrite: NEGATIVE
Protein, ur: NEGATIVE mg/dL
Specific Gravity, Urine: 1.006 (ref 1.005–1.030)
pH: 6 (ref 5.0–8.0)

## 2020-01-23 LAB — COMPREHENSIVE METABOLIC PANEL
ALT: 27 U/L (ref 0–44)
AST: 22 U/L (ref 15–41)
Albumin: 3 g/dL — ABNORMAL LOW (ref 3.5–5.0)
Alkaline Phosphatase: 75 U/L (ref 38–126)
Anion gap: 6 (ref 5–15)
BUN: <5 mg/dL — ABNORMAL LOW (ref 6–20)
CO2: 25 mmol/L (ref 22–32)
Calcium: 8.3 mg/dL — ABNORMAL LOW (ref 8.9–10.3)
Chloride: 105 mmol/L (ref 98–111)
Creatinine, Ser: 0.75 mg/dL (ref 0.44–1.00)
GFR, Estimated: >60 mL/min (ref >60)
Glucose, Bld: 116 mg/dL — ABNORMAL HIGH (ref 70–99)
Potassium: 3.6 mmol/L (ref 3.5–5.1)
Sodium: 136 mmol/L (ref 135–145)
Total Bilirubin: 0.5 mg/dL (ref 0.3–1.2)
Total Protein: 6.5 g/dL (ref 6.5–8.1)

## 2020-01-23 LAB — LIPASE, BLOOD: Lipase: 23 U/L (ref 11–51)

## 2020-01-23 MED ORDER — ONDANSETRON HCL 4 MG PO TABS: 4.0000 mg | ORAL_TABLET | Freq: Every day | ORAL | 0 refills | Status: DC | PRN

## 2020-01-23 NOTE — ED Provider Notes (Signed)
[facility] Emergency Department Provider Note  Time seen: 1:10 AM  I have reviewed the triage vital signs and the nursing notes.   HISTORY  Chief Complaint Sore Throat   HPI Michelle Lucas is a 29 y.o. female with a past medical history anxiety, migraines, presents to the emergency department for nausea vomiting diarrhea.  According to the patient she missed work today due to nausea vomiting diarrhea, states she is experiencing some mild abdominal cramping.  States she feels fine now, denies any nausea vomiting or diarrhea over the past 12 hours.  No current abdominal pain.   Past Medical History:  Diagnosis Date  . Anxiety attack    does not take meds  . Asthma    PRN inhaler   . Heart disease   . Hx of gonorrhea 05/2017  . Major depression   . Migraines   . 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 Hidradenitis suppurativa 04/06/2019  . 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  Prior to Admission medications   Not on File    Allergies  Allergen Reactions  . Ciprofloxacin Hives  . Latex Swelling    Swelling per patient  . Penicillins Swelling    Swelling at injection site Has patient had a PCN reaction causing immediate rash, facial/tongue/throat swelling, SOB or lightheadedness with hypotension: Yes Has patient had a PCN reaction causing severe rash involving mucus membranes or skin necrosis: No Has patient had a PCN reaction that required hospitalization: No Has patient had a PCN reaction occurring within the last 10 years: Yes If all of the above answers are "NO", then may proceed with Cephalosporin use.   Marland Kitchen Shrimp [Shellfish Allergy]     Family History  Problem Relation Age of Onset  . Depression Mother   . Hypertension Mother   . Depression Father   . Hypertension Father   . Depression Sister   . Depression Brother   . Hypertension  Maternal Aunt   . COPD Maternal Uncle   . Hypertension Maternal Uncle   . Hypertension Maternal Grandmother   . Breast cancer Maternal Grandmother   . Hypertension Maternal Grandfather   . Lung cancer Maternal Grandfather        Family history  . Heart disease Maternal Grandfather   . Asthma Paternal Grandmother     Social History Social History   Tobacco Use  . Smoking status: Former Smoker    Types: Cigarettes    Quit date: 03/19/2015    Years since quitting: 4.8  . Smokeless tobacco: Never Used  Vaping Use  . Vaping Use: Never used  Substance Use Topics  . Alcohol use: Not Currently  . Drug use: Not Currently    Types: Marijuana    Comment: every once in a while - 5-6 years ago     Review of Systems Constitutional: Negative for fever. Cardiovascular: Negative for chest pain. Respiratory: Negative for shortness of breath. Gastrointestinal: Intermittent abdominal cramping.  Positive for nausea vomiting diarrhea yesterday, now resolved per patient. Genitourinary: Negative for urinary compaints Musculoskeletal: Negative for musculoskeletal complaints Neurological: Negative for headache All other ROS negative  ____________________________________________   PHYSICAL EXAM:  VITAL SIGNS: ED Triage Vitals  Enc Vitals Group     BP 01/23/20 0017 123/80     Pulse Rate 01/23/20 0017 (!) 101     Resp 01/23/20 0017 20  Temp 01/23/20 0017 98.5 F (36.9 C)     Temp Source 01/23/20 0017 Oral     SpO2 01/23/20 0017 100 %     Weight 01/23/20 0003 213 lb (96.6 kg)     Height 01/23/20 0003 5\' 5"  (1.651 m)     Head Circumference --      Peak Flow --      Pain Score --      Pain Loc --      Pain Edu? --      Excl. in GC? --    Constitutional: Alert and oriented. Well appearing and in no distress. Eyes: Normal exam ENT      Head: Normocephalic and atraumatic.      Mouth/Throat: Mucous membranes are moist. Cardiovascular: Normal rate, regular rhythm.  Respiratory:  Normal respiratory effort without tachypnea nor retractions. Breath sounds are clear  Gastrointestinal: Soft and nontender. No distention. Musculoskeletal: Nontender with normal range of motion in all extremities.  Neurologic:  Normal speech and language. No gross focal neurologic deficits  Skin:  Skin is warm, dry and intact.  Psychiatric: Mood and affect are normal.   ____________________________________________   INITIAL IMPRESSION / ASSESSMENT AND PLAN / ED COURSE  Pertinent labs & imaging results that were available during my care of the patient were reviewed by me and considered in my medical decision making (see chart for details).   Patient presents emergency department for nausea vomiting diarrhea, asking for a work note.  Patient has no abdominal tenderness palpation.  We will check labs including a Covid swab and continue to closely monitor.  Overall patient appears extremely well.  Reassuring physical exam and reassuring vitals.  Lab work is normal.  We will discharge patient home with PCP follow-up.  Michelle Lucas was evaluated in Emergency Department on 01/23/2020 for the symptoms described in the history of present illness. She was evaluated in the context of the global COVID-19 pandemic, which necessitated consideration that the patient might be at risk for infection with the SARS-CoV-2 virus that causes COVID-19. Institutional protocols and algorithms that pertain to the evaluation of patients at risk for COVID-19 are in a state of rapid change based on information released by regulatory bodies including the CDC and federal and state organizations. These policies and algorithms were followed during the patient's care in the ED.  ____________________________________________   FINAL CLINICAL IMPRESSION(S) / ED DIAGNOSES  Nausea vomiting diarrhea   13/09/2019, MD 01/23/20 0149

## 2020-01-23 NOTE — ED Notes (Signed)
E sign for discharge on paper

## 2020-01-23 NOTE — ED Triage Notes (Signed)
Patient reports sore throat and cold symptoms.

## 2020-02-23 ENCOUNTER — Emergency Department
Admission: EM | Admit: 2020-02-23 | Discharge: 2020-02-23 | Disposition: A | Discharge status: Home / Self Care | Payer: Self-pay | Attending: Emergency Medicine | Admitting: Emergency Medicine

## 2020-02-23 ENCOUNTER — Encounter: Payer: Self-pay | Admitting: Intensive Care

## 2020-02-23 ENCOUNTER — Other Ambulatory Visit: Payer: Self-pay

## 2020-02-23 DIAGNOSIS — Z87891 Personal history of nicotine dependence: Secondary | ICD-10-CM | POA: Insufficient documentation

## 2020-02-23 DIAGNOSIS — K0889 Other specified disorders of teeth and supporting structures: Secondary | ICD-10-CM | POA: Insufficient documentation

## 2020-02-23 DIAGNOSIS — Z91040 Latex allergy status: Secondary | ICD-10-CM | POA: Insufficient documentation

## 2020-02-23 DIAGNOSIS — J45909 Unspecified asthma, uncomplicated: Secondary | ICD-10-CM | POA: Insufficient documentation

## 2020-02-23 MED ORDER — CLINDAMYCIN HCL 150 MG PO CAPS
300.0000 mg | ORAL_CAPSULE | Freq: Once | ORAL | Status: AC
  Administered 2020-02-23: 300 mg via ORAL
  Filled 2020-02-23: qty 2

## 2020-02-23 MED ORDER — CLINDAMYCIN HCL 300 MG PO CAPS: 300.0000 mg | ORAL_CAPSULE | Freq: Three times a day (TID) | ORAL | 0 refills | Status: AC

## 2020-02-23 NOTE — ED Triage Notes (Signed)
Patient c/o dental pain.

## 2020-02-23 NOTE — ED Provider Notes (Signed)
Emergency Department Provider Note  ____________________________________________  Time seen: Approximately 6:24 PM  I have reviewed the triage vital signs and the nursing notes.   HISTORY  Chief Complaint Dental Pain   Historian Patient   HPI Michelle Lucas is a 29 y.o. female presents to the emergency department with left lower dental pain.  Patient reports that is difficult for her to chew on the affected side.  Patient states that she has had some spontaneous drainage around inferior 18.  She denies fever and chills at home.  No pain underneath the tongue or difficulty swallowing.  She has been able to manage her own secretions.  She states that she has attempted to make an appointment with a local dentist and they recommended that she seek care in the emergency department to get started on antibiotics.   Past Medical History:  Diagnosis Date  . Anxiety attack    does not take meds  . Asthma    PRN inhaler   . Heart disease   . Hx of gonorrhea 05/2017  . Major depression   . Migraines   . Pseudoseizures (HCC)      Immunizations up to date:  Yes.     Past Medical History:  Diagnosis Date  . Anxiety attack    does not take meds  . Asthma    PRN inhaler   . Heart disease   . Hx of gonorrhea 05/2017  . Major depression   . Migraines   . Pseudoseizures [facility])     Patient Active Problem List   Diagnosis Date Noted  . Hidradenitis suppurativa 04/06/2019  . 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 No pertinent surgical history.  Prior to Admission medications   Medication Sig Start Date End Date Taking? Authorizing Provider  clindamycin (CLEOCIN) 300 MG capsule Take 1 capsule (300 mg total) by mouth 3 (three) times daily for 10 days. 02/23/20 03/04/20  Orvil Feil, PA-C  ondansetron (ZOFRAN) 4 MG tablet Take 1 tablet (4 mg total) by mouth daily as needed for nausea or vomiting. 01/23/20   Minna Antis, MD     Allergies Ciprofloxacin, Latex, Penicillins, and Shrimp [shellfish allergy]  Family History  Problem Relation Age of Onset  . Depression Mother   . Hypertension Mother   . Depression Father   . Hypertension Father   . Depression Sister   . Depression Brother   . Hypertension Maternal Aunt   . COPD Maternal Uncle   . Hypertension Maternal Uncle   . Hypertension Maternal Grandmother   . Breast cancer Maternal Grandmother   . Hypertension Maternal Grandfather   . Lung cancer Maternal Grandfather        Family history  . Heart disease Maternal Grandfather   . Asthma Paternal Grandmother     Social History Social History   Tobacco Use  . Smoking status: Former Smoker    Types: Cigarettes    Quit date: 03/19/2015    Years since quitting: 4.9  . Smokeless tobacco: Never Used  Vaping Use  . Vaping Use: Never used  Substance Use Topics  . Alcohol use: Not Currently  . Drug use: Not Currently    Types: Marijuana    Comment: every once in a while - 5-6 years ago      Review of Systems  Constitutional: No fever/chills Eyes:  No discharge ENT: Patient has broken Inferior 18.  Respiratory: no cough. No SOB/ use of accessory muscles to breath  Gastrointestinal:   No nausea, no vomiting.  No diarrhea.  No constipation. Musculoskeletal: Negative for musculoskeletal pain. Skin: Negative for rash, abrasions, lacerations, ecchymosis.  ____________________________________________   PHYSICAL EXAM:  VITAL SIGNS: ED Triage Vitals  Enc Vitals Group     BP 02/23/20 1609 114/73     Pulse Rate 02/23/20 1609 63     Resp 02/23/20 1609 16     Temp 02/23/20 1609 98.7 F (37.1 C)     Temp Source 02/23/20 1609 Oral     SpO2 02/23/20 1609 100 %     Weight 02/23/20 1610 210 lb (95.3 kg)     Height 02/23/20 1610 5' 5.5" (1.664 m)     Head Circumference --      Peak Flow --      Pain Score 02/23/20 1610 10     Pain Loc --      Pain Edu? --      Excl. in GC? --       Constitutional: Alert and oriented. Well appearing and in no acute distress. Eyes: Conjunctivae are normal. PERRL. EOMI. Head: Atraumatic. ENT:      Ears:       Nose: No congestion/rhinnorhea.      Mouth/Throat: Mucous membranes are moist.  Inferior 18 appears broken.  No pain underneath the tongue.  No drainable abscess. Neck: No stridor.  No cervical spine tenderness to palpation. Cardiovascular: Normal rate, regular rhythm. Normal S1 and S2.  Good peripheral circulation. Respiratory: Normal respiratory effort without tachypnea or retractions. Lungs CTAB. Good air entry to the bases with no decreased or absent breath sounds Musculoskeletal: Full range of motion to all extremities. No obvious deformities noted Neurologic:  Normal for age. No gross focal neurologic deficits are appreciated.  Skin:  Skin is warm, dry and intact. No rash noted. Psychiatric: Mood and affect are normal for age. Speech and behavior are normal.   ____________________________________________   LABS (all labs ordered are listed, but only abnormal results are displayed)  Labs Reviewed - No data to display ____________________________________________  EKG   ____________________________________________  RADIOLOGY  No results found.  ____________________________________________    PROCEDURES  Procedure(s) performed:     Procedures     Medications  clindamycin (CLEOCIN) capsule 300 mg (300 mg Oral Given 02/23/20 1700)     ____________________________________________   INITIAL IMPRESSION / ASSESSMENT AND PLAN / ED COURSE  Pertinent labs & imaging results that were available during my care of the patient were reviewed by me and considered in my medical decision making (see chart for details).      Assessment and plan Dental pain 29 year old female presents to the emergency department with dental pain for the past 2 days.  Patient appears to have a spontaneously draining dental  abscess.  Will start patient on clindamycin 3 times daily for the next 10 days.  Return precautions were given to return with new or worsening symptoms.     ____________________________________________  FINAL CLINICAL IMPRESSION(S) / ED DIAGNOSES  Final diagnoses:  Pain, dental      NEW MEDICATIONS STARTED DURING THIS VISIT:  ED Discharge Orders         Ordered    clindamycin (CLEOCIN) 300 MG capsule  3 times daily        02/23/20 1629              This chart was dictated using voice recognition software/Dragon. Despite best efforts to proofread, errors can occur which can change the meaning.  Any change was purely unintentional.     Orvil Feil, PA-C 02/23/20 1827    Arnaldo Natal, MD 02/24/20 [phone]

## 2020-02-23 NOTE — Discharge Instructions (Signed)
Take clindamycin as directed. Continue take Tylenol and ibuprofen alternating for pain.

## 2020-03-22 ENCOUNTER — Other Ambulatory Visit: Payer: Self-pay

## 2020-03-22 ENCOUNTER — Encounter: Payer: Self-pay | Admitting: Physician Assistant

## 2020-03-22 ENCOUNTER — Ambulatory Visit: Payer: Self-pay | Admitting: Physician Assistant

## 2020-03-22 DIAGNOSIS — Z7189 Other specified counseling: Secondary | ICD-10-CM

## 2020-03-22 DIAGNOSIS — Z113 Encounter for screening for infections with a predominantly sexual mode of transmission: Secondary | ICD-10-CM

## 2020-03-22 NOTE — Progress Notes (Signed)
S:  Patient into clinic stating that she does not think that she has a STD since she did a home test that was negative.  Reports that she has chronic constipation and was seen at the ER yesterday but has not had a bowel movement since then and was told to come here for follow up.  States that she used the enema she was told to use about 3 hr ago and was told that she should have a bowel movement within 30 min of using the enema.   O:  WDWN female in NAD, A&O x 3, normal work of breathing. A/P:   1. Patient declines STD screening today. 2.  Counseled patient that we do not do any primary care at [facility] and that she should either call her PCP office or the ER for follow up instructions about constipation. 3.  Patient states that she does not have a PCP and given PCP list to establish care. 4.  Counseled patient that she could also visit an urgent care if she desires or is not able to get in touch with the ER for follow up instructions. 5.  Per patient request, note stating that she was here provided and sent for scanning.

## 2020-03-24 ENCOUNTER — Encounter: Payer: Self-pay | Admitting: Emergency Medicine

## 2020-03-24 ENCOUNTER — Emergency Department: Payer: Self-pay

## 2020-03-24 ENCOUNTER — Other Ambulatory Visit: Payer: Self-pay

## 2020-03-24 ENCOUNTER — Emergency Department
Admission: EM | Admit: 2020-03-24 | Discharge: 2020-03-24 | Disposition: A | Discharge status: Home / Self Care | Payer: Self-pay | Attending: Emergency Medicine | Admitting: Emergency Medicine

## 2020-03-24 DIAGNOSIS — Z87891 Personal history of nicotine dependence: Secondary | ICD-10-CM | POA: Insufficient documentation

## 2020-03-24 DIAGNOSIS — J45909 Unspecified asthma, uncomplicated: Secondary | ICD-10-CM | POA: Insufficient documentation

## 2020-03-24 DIAGNOSIS — K5901 Slow transit constipation: Secondary | ICD-10-CM | POA: Insufficient documentation

## 2020-03-24 DIAGNOSIS — Z91040 Latex allergy status: Secondary | ICD-10-CM | POA: Insufficient documentation

## 2020-03-24 LAB — CBC
HCT: 37.9 % (ref 36.0–46.0)
Hemoglobin: 12 g/dL (ref 12.0–15.0)
MCH: 27.1 pg (ref 26.0–34.0)
MCHC: 31.7 g/dL (ref 30.0–36.0)
MCV: 85.6 fL (ref 80.0–100.0)
Platelets: 272 10*3/uL (ref 150–400)
RBC: 4.43 MIL/uL (ref 3.87–5.11)
RDW: 14.1 % (ref 11.5–15.5)
WBC: 6.3 10*3/uL (ref 4.0–10.5)
nRBC: 0 % (ref 0.0–0.2)

## 2020-03-24 LAB — COMPREHENSIVE METABOLIC PANEL
ALT: 20 U/L (ref 0–44)
AST: 17 U/L (ref 15–41)
Albumin: 3.5 g/dL (ref 3.5–5.0)
Alkaline Phosphatase: 73 U/L (ref 38–126)
Anion gap: 7 (ref 5–15)
BUN: <5 mg/dL — ABNORMAL LOW (ref 6–20)
CO2: 27 mmol/L (ref 22–32)
Calcium: 9.3 mg/dL (ref 8.9–10.3)
Chloride: 104 mmol/L (ref 98–111)
Creatinine, Ser: 0.74 mg/dL (ref 0.44–1.00)
GFR, Estimated: >60 mL/min (ref >60)
Glucose, Bld: 90 mg/dL (ref 70–99)
Potassium: 3.8 mmol/L (ref 3.5–5.1)
Sodium: 138 mmol/L (ref 135–145)
Total Bilirubin: 0.8 mg/dL (ref 0.3–1.2)
Total Protein: 7.8 g/dL (ref 6.5–8.1)

## 2020-03-24 LAB — URINALYSIS, COMPLETE (UACMP) WITH MICROSCOPIC
Bacteria, UA: NONE SEEN
Bilirubin Urine: NEGATIVE
Glucose, UA: NEGATIVE mg/dL
Hgb urine dipstick: NEGATIVE
Ketones, ur: NEGATIVE mg/dL
Nitrite: NEGATIVE
Protein, ur: NEGATIVE mg/dL
Specific Gravity, Urine: 1.003 — ABNORMAL LOW (ref 1.005–1.030)
Squamous Epithelial / HPF: NONE SEEN (ref 0–5)
WBC, UA: >50 WBC/hpf — ABNORMAL HIGH (ref 0–5)
pH: 7 (ref 5.0–8.0)

## 2020-03-24 LAB — LIPASE, BLOOD: Lipase: 28 U/L (ref 11–51)

## 2020-03-24 LAB — PREGNANCY, URINE: Preg Test, Ur: NEGATIVE

## 2020-03-24 MED ORDER — POLYETHYLENE GLYCOL 3350 17 G PO PACK
17.0000 g | PACK | Freq: Every day | ORAL | Status: DC
  Administered 2020-03-24: 17 g via ORAL
  Filled 2020-03-24: qty 1

## 2020-03-24 MED ORDER — MAGNESIUM CITRATE PO SOLN
1.0000 | Freq: Once | ORAL | Status: AC
  Administered 2020-03-24: 1 via ORAL
  Filled 2020-03-24: qty 296

## 2020-03-24 MED ORDER — GLYCERIN (LAXATIVE) 2.1 G RE SUPP
1.0000 | Freq: Once | RECTAL | Status: AC
  Administered 2020-03-24: 1 via RECTAL
  Filled 2020-03-24: qty 1

## 2020-03-24 MED ORDER — ONDANSETRON 4 MG PO TBDP: 4.0000 mg | ORAL_TABLET | Freq: Once | ORAL | Status: DC

## 2020-03-24 NOTE — ED Provider Notes (Signed)
[facility] Emergency Department Provider Note ____________________________________________  Time seen: 1428  I have reviewed the triage vital signs and the nursing notes.  HISTORY  Chief Complaint  Constipation  HPI Anyelina Claycomb Bushong is a 30 y.o. female with a history of chronic slow outlet constipation,  presents to the ED with several weeks of constipation.  Patient denies any current domino pain, bloating, or distention.  She does report the urge to defecate, but notes that she has increased pain at the rectum when she attempts to do so.  She denies any bright red blood per rectum, current painful hemorrhoids, or previous disimpaction.  She reports she is taken MiraLAX daily for about a week, and has a dose of magnesium citrate, and has used an over-the-counter enema with no benefit.  She presents now for evaluation of her symptoms but denies any fever, chills, chest pain, shortness of breath, vomiting, or fecal incontinence.  Past Medical History:  Diagnosis Date  . Anxiety attack    does not take meds  . Asthma    PRN inhaler   . Heart disease   . Hx of gonorrhea 05/2017  . Major depression   . Migraines   . Pseudoseizures [facility])     Patient Active Problem List   Diagnosis Date Noted  . Hidradenitis suppurativa 04/06/2019  . 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 No pertinent surgical history.  Prior to Admission medications   Medication Sig Start Date End Date Taking? Authorizing Provider  ondansetron (ZOFRAN) 4 MG tablet Take 1 tablet (4 mg total) by mouth daily as needed for nausea or vomiting. 01/23/20   Harvest Dark, MD    Allergies Ciprofloxacin, Latex, Penicillins, and Shrimp [shellfish allergy]  Family History  Problem Relation Age of Onset  . Depression Mother   . Hypertension Mother   . Depression Father   . Hypertension Father   . Depression Sister   . Depression Brother   .  Hypertension Maternal Aunt   . COPD Maternal Uncle   . Hypertension Maternal Uncle   . Hypertension Maternal Grandmother   . Breast cancer Maternal Grandmother   . Hypertension Maternal Grandfather   . Lung cancer Maternal Grandfather        Family history  . Heart disease Maternal Grandfather   . Asthma Paternal Grandmother     Social History Social History   Tobacco Use  . Smoking status: Former Smoker    Types: Cigarettes    Quit date: 03/19/2015    Years since quitting: 5.0  . Smokeless tobacco: Never Used  Vaping Use  . Vaping Use: Never used  Substance Use Topics  . Alcohol use: Not Currently  . Drug use: Not Currently    Types: Marijuana    Comment: every once in a while - 5-6 years ago     Review of Systems  Constitutional: Negative for fever. Cardiovascular: Negative for chest pain. Respiratory: Negative for shortness of breath. Gastrointestinal: Negative for abdominal pain, vomiting and diarrhea.  Reports constipation as above. Genitourinary: Negative for dysuria. Musculoskeletal: Negative for back pain. Skin: Negative for rash. Neurological: Negative for headaches, focal weakness or numbness. ____________________________________________  PHYSICAL EXAM:  VITAL SIGNS: ED Triage Vitals  Enc Vitals Group     BP 03/24/20 1329 112/72     Pulse Rate 03/24/20 1329 80     Resp 03/24/20 1329 16     Temp 03/24/20 1329 98.1 F (36.7 C)  Temp src --      SpO2 03/24/20 1329 100 %     Weight 03/24/20 1400 217 lb (98.4 kg)     Height 03/24/20 1400 5\' 5"  (1.651 m)     Head Circumference --      Peak Flow --      Pain Score 03/24/20 1400 9     Pain Loc --      Pain Edu? --      Excl. in GC? --     Constitutional: Alert and oriented. Well appearing and in no distress. Head: Normocephalic and atraumatic. Eyes: Conjunctivae are normal. Normal extraocular movements Cardiovascular: Normal rate, regular rhythm. Normal distal pulses. Respiratory: Normal  respiratory effort. No wheezes/rales/rhonchi. Gastrointestinal: Soft and nontender. No distention, rebound, guarding, or rigidity.  Normoactive bowel sounds noted.  No CVA tenderness elicited. Musculoskeletal: Nontender with normal range of motion in all extremities.  Neurologic:  Normal gait without ataxia. Normal speech and language. No gross focal neurologic deficits are appreciated. Skin:  Skin is warm, dry and intact. No rash noted. Psychiatric: Mood and affect are normal. Patient exhibits appropriate insight and judgment. ____________________________________________   LABS (pertinent positives/negatives) Labs Reviewed  COMPREHENSIVE METABOLIC PANEL - Abnormal; Notable for the following components:      Result Value   BUN <5 (*)    All other components within normal limits  URINALYSIS, COMPLETE (UACMP) WITH MICROSCOPIC - Abnormal; Notable for the following components:   Color, Urine STRAW (*)    APPearance CLEAR (*)    Specific Gravity, Urine 1.003 (*)    Leukocytes,Ua LARGE (*)    WBC, UA >50 (*)    All other components within normal limits  LIPASE, BLOOD  CBC  PREGNANCY, URINE  ____________________________________________   RADIOLOGY  Acute Chest w/ ABD  IMPRESSION: Normal examination.  I, 05/22/20, personally viewed and evaluated these images (plain radiographs) as part of my medical decision making, as well as reviewing the written report by the radiologist. ___________________________________________  PROCEDURES  Miralax 17 g PO Mag Citrate 1 bottle PO  Procedures ____________________________________________  INITIAL IMPRESSION / ASSESSMENT AND PLAN / ED COURSE  DDX: constipation, SBO, hemorrhoids, fecal impaction, diverticulitis, colitis  Patient with ED of several weeks of constipation with a history of chronic constipation.  Patient exam is overall benign return this time.  Labs also without any acute findings.  Patient's plain film x-ray  reveals normal to moderate stool burden without signs of small bowel obstruction or fecal impaction.  Patient reports of improvement of her symptoms after ED med ministration.  She will be discharged with instructions to start a liquid diet as well as twice daily to 3 times daily dosing of MiraLAX until she has a meaningful stool.  She will follow-up with primary provider for ongoing symptoms.  Her precautions have been discussed.  CELINA SHILEY was evaluated in Emergency Department on 03/24/2020 for the symptoms described in the history of present illness. She was evaluated in the context of the global COVID-19 pandemic, which necessitated consideration that the patient might be at risk for infection with the SARS-CoV-2 virus that causes COVID-19. Institutional protocols and algorithms that pertain to the evaluation of patients at risk for COVID-19 are in a state of rapid change based on information released by regulatory bodies including the CDC and federal and state organizations. These policies and algorithms were followed during the patient's care in the ED. ____________________________________________  FINAL CLINICAL IMPRESSION(S) / ED DIAGNOSES  Final diagnoses:  Slow transit constipation      Brileigh Sevcik, Charlesetta Ivory, PA-C 03/24/20 1907    Concha Se, MD 03/24/20 2022

## 2020-03-24 NOTE — ED Triage Notes (Signed)
Pt to ED via POV. Pt states that she has not had a bowel movement since 01/29/20. Pt states that every time she tries to have a BM she vomits. Pt is in NAD at this time. Pt states that she is able to pass gas.

## 2020-03-24 NOTE — Discharge Instructions (Addendum)
Your exam and labs are normal at this time.  You should start MiraLAX 3 times daily along with a clear liquid diet over the next few days to help promote a normal stool.  Continue to take MiraLAX daily for the rest of your days to keep your stools regular.  Follow-up primary provider return to the ED if needed.

## 2020-04-19 ENCOUNTER — Encounter: Payer: Self-pay | Admitting: Emergency Medicine

## 2020-04-19 ENCOUNTER — Other Ambulatory Visit: Payer: Self-pay

## 2020-04-19 ENCOUNTER — Emergency Department
Admission: EM | Admit: 2020-04-19 | Discharge: 2020-04-19 | Disposition: A | Discharge status: Home / Self Care | Payer: HRSA Program | Attending: Emergency Medicine | Admitting: Emergency Medicine

## 2020-04-19 DIAGNOSIS — Z91040 Latex allergy status: Secondary | ICD-10-CM | POA: Diagnosis not present

## 2020-04-19 DIAGNOSIS — Z20822 Contact with and (suspected) exposure to covid-19: Secondary | ICD-10-CM | POA: Insufficient documentation

## 2020-04-19 DIAGNOSIS — L539 Erythematous condition, unspecified: Secondary | ICD-10-CM | POA: Diagnosis not present

## 2020-04-19 DIAGNOSIS — Z87891 Personal history of nicotine dependence: Secondary | ICD-10-CM | POA: Diagnosis not present

## 2020-04-19 DIAGNOSIS — M791 Myalgia, unspecified site: Secondary | ICD-10-CM | POA: Insufficient documentation

## 2020-04-19 DIAGNOSIS — R110 Nausea: Secondary | ICD-10-CM

## 2020-04-19 DIAGNOSIS — J45909 Unspecified asthma, uncomplicated: Secondary | ICD-10-CM | POA: Insufficient documentation

## 2020-04-19 DIAGNOSIS — R519 Headache, unspecified: Secondary | ICD-10-CM | POA: Diagnosis present

## 2020-04-19 MED ORDER — ONDANSETRON 4 MG PO TBDP: 4.0000 mg | ORAL_TABLET | Freq: Three times a day (TID) | ORAL | 0 refills | Status: AC | PRN

## 2020-04-19 MED ORDER — ONDANSETRON 4 MG PO TBDP
4.0000 mg | ORAL_TABLET | Freq: Once | ORAL | Status: AC
  Administered 2020-04-19: 4 mg via ORAL
  Filled 2020-04-19: qty 1

## 2020-04-19 MED ORDER — ALBUTEROL SULFATE HFA 108 (90 BASE) MCG/ACT IN AERS: 2.0000 | INHALATION_SPRAY | Freq: Four times a day (QID) | RESPIRATORY_TRACT | 2 refills | Status: DC | PRN

## 2020-04-19 MED ORDER — ALBUTEROL SULFATE HFA 108 (90 BASE) MCG/ACT IN AERS: 2.0000 | INHALATION_SPRAY | Freq: Four times a day (QID) | RESPIRATORY_TRACT | 2 refills | Status: AC | PRN

## 2020-04-19 NOTE — ED Triage Notes (Signed)
Pt comes into the ED via POV c/o headache, body aches, emesis, and exposure to COVID.  Pt has tested once and it was negative, but patient is still having symptoms and wants to be tested again.  Pt has even and unlabored respirations and is ambulatory to triage.

## 2020-04-19 NOTE — ED Notes (Signed)
See triage note  Presents with body aches and loss of taste for 1-2 days   States she has had some vomiting afebrile on arrival but has been exposed to COVID  Had negative test 3 weeks ago

## 2020-04-19 NOTE — ED Provider Notes (Signed)
[facility]-EMERGENCY DEPARTMENT  ____________________________________________  Time seen: Approximately 5:17 PM  I have reviewed the triage vital signs and the nursing notes.   HISTORY  Chief Complaint Generalized Body Aches and Headache   Historian Patient     HPI Michelle Lucas is a 30 y.o. female presents to the emergency department with headache, nausea and body aches.  Patient states that she has been symptomatic for the past 2 days.  She has had no fever but states that she has been exposed to someone who has had Covid.  She tested negative for Covid 3 weeks ago but has not been tested recently.  Patient states that she took a pregnancy test this morning but is currently taking Depo so her concerns for pregnancy are well.  She denies chest pain, chest tightness or abdominal pain.  No other alleviating measures have been attempted.   Past Medical History:  Diagnosis Date  . Anxiety attack    does not take meds  . Asthma    PRN inhaler   . Heart disease   . Hx of gonorrhea 05/2017  . Major depression   . Migraines   . Pseudoseizures (HCC)      Immunizations up to date:  Yes.     Past Medical History:  Diagnosis Date  . Anxiety attack    does not take meds  . Asthma    PRN inhaler   . Heart disease   . Hx of gonorrhea 05/2017  . Major depression   . Migraines   . Pseudoseizures [facility])     Patient Active Problem List   Diagnosis Date Noted  . Hidradenitis suppurativa 04/06/2019  . 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 No pertinent surgical history.  Prior to Admission medications   Medication Sig Start Date End Date Taking? Authorizing Provider  ondansetron (ZOFRAN ODT) 4 MG disintegrating tablet Take 1 tablet (4 mg total) by mouth every 8 (eight) hours as needed for up to 5 days. 04/19/20 04/24/20 Yes Pia Mau M, PA-C  albuterol (VENTOLIN HFA) 108 (90 Base) MCG/ACT inhaler Inhale 2 puffs into the lungs every 6  (six) hours as needed for wheezing or shortness of breath. 04/19/20   Orvil Feil, PA-C    Allergies Ciprofloxacin, Latex, Penicillins, and Shrimp [shellfish allergy]  Family History  Problem Relation Age of Onset  . Depression Mother   . Hypertension Mother   . Depression Father   . Hypertension Father   . Depression Sister   . Depression Brother   . Hypertension Maternal Aunt   . COPD Maternal Uncle   . Hypertension Maternal Uncle   . Hypertension Maternal Grandmother   . Breast cancer Maternal Grandmother   . Hypertension Maternal Grandfather   . Lung cancer Maternal Grandfather        Family history  . Heart disease Maternal Grandfather   . Asthma Paternal Grandmother     Social History Social History   Tobacco Use  . Smoking status: Former Smoker    Types: Cigarettes    Quit date: 03/19/2015    Years since quitting: 5.0  . Smokeless tobacco: Never Used  Vaping Use  . Vaping Use: Never used  Substance Use Topics  . Alcohol use: Not Currently  . Drug use: Not Currently    Types: Marijuana    Comment: every once in a while - 5-6 years ago       Review of Systems  Constitutional: Patient has fever.  Eyes: No visual changes. No discharge ENT: Patient has congestion.  Cardiovascular: no chest pain. Respiratory: Patient has cough.  Gastrointestinal: No abdominal pain.  No nausea, no vomiting. Patient had diarrhea.  Genitourinary: Negative for dysuria. No hematuria Musculoskeletal: Patient has myalgias.  Skin: Negative for rash, abrasions, lacerations, ecchymosis. Neurological: Patient has headache, no focal weakness or numbness.     ____________________________________________   PHYSICAL EXAM:  VITAL SIGNS: ED Triage Vitals  Enc Vitals Group     BP 04/19/20 1556 128/84     Pulse Rate 04/19/20 1556 90     Resp 04/19/20 1556 16     Temp 04/19/20 1556 98.2 F (36.8 C)     Temp Source 04/19/20 1556 Oral     SpO2 --      Weight 04/19/20 1556 215 lb  (97.5 kg)     Height 04/19/20 1556 5\' 5"  (1.651 m)     Head Circumference --      Peak Flow --      Pain Score 04/19/20 1556 7     Pain Loc --      Pain Edu? --      Excl. in GC? --      Constitutional: Alert and oriented. Patient is lying supine. Eyes: Conjunctivae are normal. PERRL. EOMI. Head: Atraumatic. ENT:      Ears: Tympanic membranes are mildly injected with mild effusion bilaterally.       Nose: No congestion/rhinnorhea.      Mouth/Throat: Mucous membranes are moist. Posterior pharynx is mildly erythematous.  Hematological/Lymphatic/Immunilogical: No cervical lymphadenopathy.  Cardiovascular: Normal rate, regular rhythm. Normal S1 and S2.  Good peripheral circulation. Respiratory: Normal respiratory effort without tachypnea or retractions. Lungs CTAB. Good air entry to the bases with no decreased or absent breath sounds. Gastrointestinal: Bowel sounds 4 quadrants. Soft and nontender to palpation. No guarding or rigidity. No palpable masses. No distention. No CVA tenderness. Musculoskeletal: Full range of motion to all extremities. No gross deformities appreciated. Neurologic:  Normal speech and language. No gross focal neurologic deficits are appreciated.  Skin:  Skin is warm, dry and intact. No rash noted. Psychiatric: Mood and affect are normal. Speech and behavior are normal. Patient exhibits appropriate insight and judgement.    ____________________________________________   LABS (all labs ordered are listed, but only abnormal results are displayed)  Labs Reviewed  SARS CORONAVIRUS 2 (TAT 6-24 HRS)   ____________________________________________  EKG   ____________________________________________  RADIOLOGY   No results found.  ____________________________________________    PROCEDURES  Procedure(s) performed:     Procedures     Medications  ondansetron (ZOFRAN-ODT) disintegrating tablet 4 mg (4 mg Oral Given 04/19/20 1711)      ____________________________________________   INITIAL IMPRESSION / ASSESSMENT AND PLAN / ED COURSE  Pertinent labs & imaging results that were available during my care of the patient were reviewed by me and considered in my medical decision making (see chart for details).      Assessment and plan Headache Body aches 30 year old female presents to the emergency department with Covid-like symptoms COVID-19 testing is in process at this time and patient feels comfortable awaiting results at home.  Patient was discharged with an albuterol inhaler and Zofran.  Return precautions were given to return with new or worsening symptoms.     ____________________________________________  FINAL CLINICAL IMPRESSION(S) / ED DIAGNOSES  Final diagnoses:  Acute nonintractable headache, unspecified headache type  Nausea      NEW MEDICATIONS STARTED DURING THIS VISIT:  ED  Discharge Orders         Ordered    ondansetron (ZOFRAN ODT) 4 MG disintegrating tablet  Every 8 hours PRN        04/19/20 1713    albuterol (VENTOLIN HFA) 108 (90 Base) MCG/ACT inhaler  Every 6 hours PRN,   Status:  Discontinued        04/19/20 1713    albuterol (VENTOLIN HFA) 108 (90 Base) MCG/ACT inhaler  Every 6 hours PRN        04/19/20 1714              This chart was dictated using voice recognition software/Dragon. Despite best efforts to proofread, errors can occur which can change the meaning. Any change was purely unintentional.     Orvil Feil, PA-C 04/19/20 2203    Concha Se, MD 04/20/20 [phone]

## 2020-04-19 NOTE — Discharge Instructions (Addendum)
You can take Zofran up to 3 times daily for the next 5 days for nausea. You have been prescribed an albuterol inhaler for shortness of breath and wheezing.  Please continue taking Tylenol and ibuprofen alternating for fever and body aches.

## 2020-04-20 LAB — SARS CORONAVIRUS 2 (TAT 6-24 HRS): SARS Coronavirus 2: NEGATIVE

## 2020-05-03 ENCOUNTER — Encounter (HOSPITAL_COMMUNITY): Payer: Self-pay | Admitting: Radiology

## 2020-05-03 ENCOUNTER — Emergency Department (HOSPITAL_COMMUNITY): Payer: Self-pay

## 2020-05-03 ENCOUNTER — Inpatient Hospital Stay (HOSPITAL_COMMUNITY)
Admission: EM | Admit: 2020-05-03 | Discharge: 2020-05-05 | DRG: 882 | Disposition: A | Discharge status: Home Health | Payer: Self-pay | Attending: Family Medicine | Admitting: Family Medicine

## 2020-05-03 ENCOUNTER — Inpatient Hospital Stay (HOSPITAL_COMMUNITY): Payer: Self-pay

## 2020-05-03 DIAGNOSIS — R011 Cardiac murmur, unspecified: Secondary | ICD-10-CM | POA: Diagnosis present

## 2020-05-03 DIAGNOSIS — Z818 Family history of other mental and behavioral disorders: Secondary | ICD-10-CM

## 2020-05-03 DIAGNOSIS — Z825 Family history of asthma and other chronic lower respiratory diseases: Secondary | ICD-10-CM

## 2020-05-03 DIAGNOSIS — F410 Panic disorder [episodic paroxysmal anxiety] without agoraphobia: Secondary | ICD-10-CM | POA: Diagnosis present

## 2020-05-03 DIAGNOSIS — Z91013 Allergy to seafood: Secondary | ICD-10-CM

## 2020-05-03 DIAGNOSIS — E876 Hypokalemia: Secondary | ICD-10-CM | POA: Diagnosis present

## 2020-05-03 DIAGNOSIS — Z87891 Personal history of nicotine dependence: Secondary | ICD-10-CM

## 2020-05-03 DIAGNOSIS — L732 Hidradenitis suppurativa: Secondary | ICD-10-CM | POA: Diagnosis present

## 2020-05-03 DIAGNOSIS — R569 Unspecified convulsions: Secondary | ICD-10-CM | POA: Diagnosis present

## 2020-05-03 DIAGNOSIS — Z880 Allergy status to penicillin: Secondary | ICD-10-CM

## 2020-05-03 DIAGNOSIS — J45909 Unspecified asthma, uncomplicated: Secondary | ICD-10-CM | POA: Diagnosis present

## 2020-05-03 DIAGNOSIS — R29898 Other symptoms and signs involving the musculoskeletal system: Secondary | ICD-10-CM

## 2020-05-03 DIAGNOSIS — F4324 Adjustment disorder with disturbance of conduct: Secondary | ICD-10-CM | POA: Diagnosis present

## 2020-05-03 DIAGNOSIS — Z20822 Contact with and (suspected) exposure to covid-19: Secondary | ICD-10-CM | POA: Diagnosis present

## 2020-05-03 DIAGNOSIS — Z881 Allergy status to other antibiotic agents status: Secondary | ICD-10-CM

## 2020-05-03 DIAGNOSIS — F449 Dissociative and conversion disorder, unspecified: Secondary | ICD-10-CM | POA: Diagnosis present

## 2020-05-03 DIAGNOSIS — F458 Other somatoform disorders: Principal | ICD-10-CM | POA: Diagnosis present

## 2020-05-03 DIAGNOSIS — F411 Generalized anxiety disorder: Secondary | ICD-10-CM | POA: Diagnosis present

## 2020-05-03 DIAGNOSIS — F32A Depression, unspecified: Secondary | ICD-10-CM | POA: Diagnosis present

## 2020-05-03 DIAGNOSIS — Z8249 Family history of ischemic heart disease and other diseases of the circulatory system: Secondary | ICD-10-CM

## 2020-05-03 DIAGNOSIS — G43909 Migraine, unspecified, not intractable, without status migrainosus: Secondary | ICD-10-CM | POA: Diagnosis present

## 2020-05-03 DIAGNOSIS — Z91040 Latex allergy status: Secondary | ICD-10-CM

## 2020-05-03 DIAGNOSIS — F1290 Cannabis use, unspecified, uncomplicated: Secondary | ICD-10-CM | POA: Diagnosis present

## 2020-05-03 DIAGNOSIS — I10 Essential (primary) hypertension: Secondary | ICD-10-CM | POA: Diagnosis present

## 2020-05-03 DIAGNOSIS — R531 Weakness: Secondary | ICD-10-CM

## 2020-05-03 DIAGNOSIS — M216X1 Other acquired deformities of right foot: Secondary | ICD-10-CM

## 2020-05-03 LAB — COMPREHENSIVE METABOLIC PANEL
ALT: 14 U/L (ref 0–44)
AST: 22 U/L (ref 15–41)
Albumin: 3.5 g/dL (ref 3.5–5.0)
Alkaline Phosphatase: 70 U/L (ref 38–126)
Anion gap: 10 (ref 5–15)
BUN: <5 mg/dL — ABNORMAL LOW (ref 6–20)
CO2: 22 mmol/L (ref 22–32)
Calcium: 9.3 mg/dL (ref 8.9–10.3)
Chloride: 106 mmol/L (ref 98–111)
Creatinine, Ser: 0.82 mg/dL (ref 0.44–1.00)
GFR, Estimated: >60 mL/min (ref >60)
Glucose, Bld: 90 mg/dL (ref 70–99)
Potassium: 3.8 mmol/L (ref 3.5–5.1)
Sodium: 138 mmol/L (ref 135–145)
Total Bilirubin: 1.7 mg/dL — ABNORMAL HIGH (ref 0.3–1.2)
Total Protein: 7.4 g/dL (ref 6.5–8.1)

## 2020-05-03 LAB — CBC
HCT: 39 % (ref 36.0–46.0)
Hemoglobin: 12.9 g/dL (ref 12.0–15.0)
MCH: 28.4 pg (ref 26.0–34.0)
MCHC: 33.1 g/dL (ref 30.0–36.0)
MCV: 85.7 fL (ref 80.0–100.0)
Platelets: 296 10*3/uL (ref 150–400)
RBC: 4.55 MIL/uL (ref 3.87–5.11)
RDW: 14.8 % (ref 11.5–15.5)
WBC: 6.1 10*3/uL (ref 4.0–10.5)
nRBC: 0 % (ref 0.0–0.2)

## 2020-05-03 LAB — DIFFERENTIAL
Abs Immature Granulocytes: 0.01 10*3/uL (ref 0.00–0.07)
Basophils Absolute: 0 10*3/uL (ref 0.0–0.1)
Basophils Relative: 1 %
Eosinophils Absolute: 0.1 10*3/uL (ref 0.0–0.5)
Eosinophils Relative: 2 %
Immature Granulocytes: 0 %
Lymphocytes Relative: 47 %
Lymphs Abs: 2.8 10*3/uL (ref 0.7–4.0)
Monocytes Absolute: 0.4 10*3/uL (ref 0.1–1.0)
Monocytes Relative: 6 %
Neutro Abs: 2.7 10*3/uL (ref 1.7–7.7)
Neutrophils Relative %: 44 %

## 2020-05-03 LAB — HIV ANTIBODY (ROUTINE TESTING W REFLEX): HIV Screen 4th Generation wRfx: NONREACTIVE

## 2020-05-03 LAB — PROTIME-INR
INR: 1 (ref 0.8–1.2)
Prothrombin Time: 13 seconds (ref 11.4–15.2)

## 2020-05-03 LAB — I-STAT CHEM 8, ED
BUN: 4 mg/dL — ABNORMAL LOW (ref 6–20)
Calcium, Ion: 1.17 mmol/L (ref 1.15–1.40)
Chloride: 103 mmol/L (ref 98–111)
Creatinine, Ser: 0.8 mg/dL (ref 0.44–1.00)
Glucose, Bld: 85 mg/dL (ref 70–99)
HCT: 41 % (ref 36.0–46.0)
Hemoglobin: 13.9 g/dL (ref 12.0–15.0)
Potassium: 3.9 mmol/L (ref 3.5–5.1)
Sodium: 139 mmol/L (ref 135–145)
TCO2: 25 mmol/L (ref 22–32)

## 2020-05-03 LAB — VITAMIN B12: Vitamin B-12: 211 pg/mL (ref 180–914)

## 2020-05-03 LAB — MAGNESIUM: Magnesium: 1.8 mg/dL (ref 1.7–2.4)

## 2020-05-03 LAB — RAPID URINE DRUG SCREEN, HOSP PERFORMED
Amphetamines: NOT DETECTED
Barbiturates: NOT DETECTED
Benzodiazepines: NOT DETECTED
Cocaine: NOT DETECTED
Opiates: NOT DETECTED
Tetrahydrocannabinol: POSITIVE — AB

## 2020-05-03 LAB — I-STAT BETA HCG BLOOD, ED (MC, WL, AP ONLY): I-stat hCG, quantitative: <5 m[IU]/mL (ref <5)

## 2020-05-03 LAB — RESP PANEL BY RT-PCR (FLU A&B, COVID) ARPGX2
Influenza A by PCR: NEGATIVE
Influenza B by PCR: NEGATIVE
SARS Coronavirus 2 by RT PCR: NEGATIVE

## 2020-05-03 LAB — TSH: TSH: 1.584 u[IU]/mL (ref 0.350–4.500)

## 2020-05-03 LAB — CBG MONITORING, ED: Glucose-Capillary: 89 mg/dL (ref 70–99)

## 2020-05-03 LAB — APTT: aPTT: 28 seconds (ref 24–36)

## 2020-05-03 MED ORDER — SODIUM CHLORIDE 0.9% FLUSH
3.0000 mL | Freq: Once | INTRAVENOUS | Status: DC
Start: 2020-05-03 — End: 2020-05-03

## 2020-05-03 MED ORDER — IOHEXOL 350 MG/ML SOLN
100.0000 mL | Freq: Once | INTRAVENOUS | Status: AC | PRN
  Administered 2020-05-03: 100 mL via INTRAVENOUS

## 2020-05-03 MED ORDER — ACETAMINOPHEN 325 MG PO TABS
650.0000 mg | ORAL_TABLET | Freq: Four times a day (QID) | ORAL | Status: DC | PRN
  Administered 2020-05-03: 650 mg via ORAL
  Filled 2020-05-03 (×2): qty 2

## 2020-05-03 MED ORDER — ENOXAPARIN SODIUM 40 MG/0.4ML ~~LOC~~ SOLN
40.0000 mg | SUBCUTANEOUS | Status: DC
  Administered 2020-05-03 – 2020-05-04 (×2): 40 mg via SUBCUTANEOUS
  Filled 2020-05-03 (×2): qty 0.4

## 2020-05-03 MED ORDER — ACETAMINOPHEN 650 MG RE SUPP: 650.0000 mg | Freq: Four times a day (QID) | RECTAL | Status: DC | PRN

## 2020-05-03 MED ORDER — ONDANSETRON HCL 4 MG PO TABS
4.0000 mg | ORAL_TABLET | Freq: Once | ORAL | Status: AC
  Administered 2020-05-03: 4 mg via ORAL
  Filled 2020-05-03: qty 1

## 2020-05-03 NOTE — ED Triage Notes (Signed)
Pt arrives via EMS from home with complaints of right sided numbness. Pt LSN 0130. Pt endorses back pain 8/10.  18g RT AC 96.2 Kg 130/85 HR 85 98%

## 2020-05-03 NOTE — Progress Notes (Signed)
  FMTS Attending Admission Note: Burley Saver, MD  Personal pager:  [phone] FPTS Service Pager:  [phone]   I  have seen and examined this patient, reviewed their chart. I have discussed this patient with the resident. I agree with the resident's findings, assessment and care plan.  Ms Sigman woke up this morning and noticed she has R sided weakness. She notes no new medications, trauma, substance use prior to this episode. Last known well was when she went to bed at 0130 AM today.   Neuro exam with limited effort to lift RLE against gravity, CN II-XII intact except for decreased sensation of R face and right upper extremity to light touch. PERRL. Exam inconsistent when distracted.  Lab work unremarkable, ECG NSR with TWI in lead III, otherwise unremarkable.  1. 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 Lab work thus far unremarkable, further labs pending. UDS + THC. PT/OT recs.  Resident note to follow.

## 2020-05-03 NOTE — ED Notes (Signed)
Unable to assess NIH d/t  Pt being in MRI

## 2020-05-03 NOTE — Consult Note (Signed)
Neurology Consultation Reason for Consult: stroke alert Referring Physician: ED  CC: right arm weakness  History is obtained from: the patient and EMS personnel.  HPI: Michelle SLABAUGH is a 30 y.o. female who was last known normal at 1:30 AM. She went to bed, and was still paretic this AM upon awakening, so decided to come in. It's not entirely clear why she waited. There is no c/o headache, no prior episodes of this type, but she does have a history of anxiety/panic, major depression, and pseudoseizures. No definite seizure activity at the time of onset of the spell. When I question her she speaks in a muted, soft, delayed fashion and I can barely hear her.   LKW: 1:30am tpa given?: no, out of window. Premorbid modified rankin scale: 0 ICH Score: n/a    ROS: A 14 point ROS was performed and is negative except as noted in the HPI.   Past Medical History:  Diagnosis Date  . Anxiety attack    does not take meds  . Asthma    PRN inhaler   . Heart disease   . Hx of gonorrhea 05/2017  . Major depression   . Migraines   . Pseudoseizures (HCC)     Family History  Problem Relation Age of Onset  . Depression Mother   . Hypertension Mother   . Depression Father   . Hypertension Father   . Depression Sister   . Depression Brother   . Hypertension Maternal Aunt   . COPD Maternal Uncle   . Hypertension Maternal Uncle   . Hypertension Maternal Grandmother   . Breast cancer Maternal Grandmother   . Hypertension Maternal Grandfather   . Lung cancer Maternal Grandfather        Family history  . Heart disease Maternal Grandfather   . Asthma Paternal Grandmother    Social History:  reports that she quit smoking about 5 years ago. Her smoking use included cigarettes. She has never used smokeless tobacco. She reports previous alcohol use. She reports previous drug use. Drug: Marijuana.  Exam: Current vital signs: There were no vitals taken for this visit. Vital signs in last 24  hours:    Physical Exam  Constitutional: Appears well-developed and well-nourished.  Psych: Affect appropriate to situation Eyes: No scleral injection HENT: No OP obstrucion MSK: no joint deformities.  Cardiovascular: Normal rate and regular rhythm.  Respiratory: Effort normal, non-labored breathing GI: Soft.  No distension. There is no tenderness.  Skin: WDI  Neuro: Mental Status: Patient is awake, alert, oriented to person, place, month, year, and situation. Patient is able to give a clear and coherent history, but volume is reduced to an almost inaudible level. No signs of aphasia or neglect. Cranial Nerves: II: Visual Fields are full. Pupils are equal, round, and reactive to light. III,IV, VI: EOMI without ptosis or diploplia.  V: Facial sensation is symmetric to temperature with splitting of vibration across the forehead (reduced on the right). VII: Facial movement is asymmetric, but the right NLF is actually more prominent than the left. VIII: hearing is intact to voice X: Uvula elevates symmetrically XI: Shoulder shrug is symmetric. XII: tongue is deviated to the right. Motor: Tone is normal. Bulk is normal. 5/5 strength was present on the left. She does not move her right arm well, but when I place her arms overhead, she holds them there without drift, letting her right wrist droop. She has a Hoover's sign, indicating poor effort to elevate the right leg.  Will not wiggle her foot on the right. Sensory: Sensation is notable for diminished light touch and temperature/vibration on the right, with splitting of sensation across the forehead. Deep Tendon Reflexes: Deferred Plantars: deferred Cerebellar: FTN was very slow and deliberate. In fact, at first she held her arm up on the right and said she couldn't get it to bend to move toward her nose.  CT - negative   CTA/CTP= Right distal P2 on CTA. Normal perfusion.  I have reviewed labs in epic and the results pertinent to  this consultation are: Recent Cr OK.  I have reviewed the images obtained: as noted above.  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 There is splitting of vibratory sensation across the forehead, absence of drift and no objective weakness when she's distracted, Hoover's sign, soft voice quality, and other functional findings. The P2 stenosis is on the wrong side. - Unlikely to be post-ictal paralysis, due to splitting of vibration across the forehead and other functional characteristics.  Recommendations: 1) medicine admission for MRI brain and psychiatric consultation 2) OK to give ASA 81 mg/d 3) defer further stroke workup 4) Will follow up MR results and then comment. Otherwise, will be available as needed, should further questions arise.  Thank you.    Aram Candela Absher

## 2020-05-03 NOTE — Code Documentation (Signed)
Patient from home with boyfriend. She woke up at 0130 with weakness and numbness in her right arm/leg and a facial droop. She decided not to call 911 until her boyfriend convinced her later in the morning. Pt with hx of seizures and asthma.  [facility] picked her up and assessed facial droop, slurred speech and right sided weakness and numbness. She was taken to [facility] and a code stroke was activated. She was met by the EDP and Stroke team and cleared for CT. She was taken for a CT/CTA/CTP. NIHSS 5 (see documentation). No TPA r/t outside the window. She is LVO negative according to MD and scan results. See results below. Care Plan: routine workup, MRI, q2x12 then q4 vitals/mNIHSS. Hand off with Kathlee Nations, RN. Brayli Klingbeil, Rande Brunt, RN   "IMPRESSION: 1. 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 2. Otherwise, no significant stenosis or large vessel occlusion. 3. Normal perfusion study."

## 2020-05-03 NOTE — ED Notes (Signed)
Patient transported to MRI 

## 2020-05-03 NOTE — ED Notes (Addendum)
Pt transported to MRI 

## 2020-05-03 NOTE — ED Provider Notes (Signed)
[facility] EMERGENCY DEPARTMENT Provider Note   CSN: [id number] Arrival date & time: 05/03/20  1114  An emergency department physician performed an initial assessment on this suspected stroke patient at 1115.  History Chief Complaint  Patient presents with  . Numbness    Michelle Lucas is a 30 y.o. female.  30 year old female with prior medical history as detailed below presents for evaluation.  Patient was last known normal around 1:30 AM.  Patient reports waking up with right arm weakness.  She also complains of feeling like her left face is " weak" as well.  Patient otherwise is without complaint.  She appears to be comfortable.  She is in no distress.  She denies prior history of stroke.  Patient seen by the code stroke neuro team on arrival to the ED.  The history is provided by the patient and medical records.  Illness Location:  Right arm weakness, facial droop, LKN 1:30AM Severity:  Mild Onset quality:  Sudden Timing:  Constant Progression:  Unchanged Chronicity:  New      Past Medical History:  Diagnosis Date  . Anxiety attack    does not take meds  . Asthma    PRN inhaler   . Heart disease   . Hx of gonorrhea 05/2017  . Major depression   . Migraines   . Pseudoseizures [facility])     Patient Active Problem List   Diagnosis Date Noted  . Right sided weakness 05/03/2020  . Hidradenitis suppurativa 04/06/2019  . 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   OB History    Gravida  1   Para  1   Term  0   Preterm  1   AB  0   Living  1     SAB  0   IAB  0   Ectopic  0   Multiple  0   Live Births  0           Family History  Problem Relation Age of Onset  . Depression Mother   . Hypertension Mother   . Depression Father   . Hypertension Father   . Depression Sister   . Depression Brother   . Hypertension Maternal Aunt   . COPD Maternal Uncle   .  Hypertension Maternal Uncle   . Hypertension Maternal Grandmother   . Breast cancer Maternal Grandmother   . Hypertension Maternal Grandfather   . Lung cancer Maternal Grandfather        Family history  . Heart disease Maternal Grandfather   . Asthma Paternal Grandmother     Social History   Tobacco Use  . Smoking status: Former Smoker    Types: Cigarettes    Quit date: 03/19/2015    Years since quitting: 5.1  . Smokeless tobacco: Never Used  Vaping Use  . Vaping Use: Never used  Substance Use Topics  . Alcohol use: Not Currently  . Drug use: Not Currently    Types: Marijuana    Comment: every once in a while - 5-6 years ago     Home Medications Prior to Admission medications   Medication Sig Start Date End Date Taking? Authorizing Provider  albuterol (VENTOLIN HFA) 108 (90 Base) MCG/ACT inhaler Inhale 2 puffs into the lungs every 6 (six) hours as needed for wheezing or shortness of breath. 04/19/20   Orvil Feil, PA-C    Allergies    Ciprofloxacin, Latex,  Penicillins, and Shrimp [shellfish allergy]  Review of Systems   Review of Systems  All other systems reviewed and are negative.   Physical Exam Updated Vital Signs BP (!) 116/55   Pulse 72   Temp 98.2 F (36.8 C) (Oral)   Resp 14   Ht 5\' 5"  (1.651 m)   Wt 96.2 kg   SpO2 100%   BMI 35.29 kg/m   Physical Exam Vitals and nursing note reviewed.  Constitutional:      General: She is not in acute distress.    Appearance: She is well-developed and well-nourished.  HENT:     Head: Normocephalic and atraumatic.     Mouth/Throat:     Mouth: Oropharynx is clear and moist.  Eyes:     Extraocular Movements: EOM normal.     Conjunctiva/sclera: Conjunctivae normal.     Pupils: Pupils are equal, round, and reactive to light.  Cardiovascular:     Rate and Rhythm: Normal rate and regular rhythm.     Heart sounds: Normal heart sounds.  Pulmonary:     Effort: Pulmonary effort is normal. No respiratory distress.      Breath sounds: Normal breath sounds.  Abdominal:     General: There is no distension.     Palpations: Abdomen is soft.     Tenderness: There is no abdominal tenderness.  Musculoskeletal:        General: No deformity or edema. Normal range of motion.     Cervical back: Normal range of motion and neck supple.  Skin:    General: Skin is warm and dry.  Neurological:     Mental Status: She is alert and oriented to person, place, and time.     Comments: Normal speech  Alert and oriented x4  Mild left facial droop  Mild right arm weakness -although patient may not be fully compliant with exam    Psychiatric:        Mood and Affect: Mood and affect normal.     ED Results / Procedures / Treatments   Labs (all labs ordered are listed, but only abnormal results are displayed) Labs Reviewed  COMPREHENSIVE METABOLIC PANEL - Abnormal; Notable for the following components:      Result Value   BUN <5 (*)    Total Bilirubin 1.7 (*)    All other components within normal limits  I-STAT CHEM 8, ED - Abnormal; Notable for the following components:   BUN 4 (*)    All other components within normal limits  RESP PANEL BY RT-PCR (FLU A&B, COVID) ARPGX2  PROTIME-INR  APTT  CBC  DIFFERENTIAL  RAPID URINE DRUG SCREEN, HOSP PERFORMED  CBG MONITORING, ED  CBG MONITORING, ED  I-STAT BETA HCG BLOOD, ED ([facility], [facility], [facility] ONLY)    EKG EKG Interpretation  Date/Time:  Wednesday May 03 2020 11:50:11 EST Ventricular Rate:  78 PR Interval:    QRS Duration: 92 QT Interval:  382 QTC Calculation: 436 R Axis:   60 Text Interpretation: Sinus rhythm Nonspecific T abnormalities, anterior leads Confirmed by Kristine RoyalMessick, Peter [phone]([id number]) on 05/03/2020 12:05:16 PM   Radiology CT Code Stroke CTA Head W/WO contrast  Result Date: 05/03/2020 CLINICAL DATA:  Neuro deficit, acute stroke suspected. EXAM: CT ANGIOGRAPHY HEAD AND NECK CT PERFUSION BRAIN TECHNIQUE: Multidetector CT imaging of the head and neck was  performed using the standard protocol during bolus administration of intravenous contrast. Multiplanar CT image reconstructions and MIPs were obtained to evaluate the vascular anatomy. Carotid stenosis measurements (  when applicable) are obtained utilizing NASCET criteria, using the distal internal carotid diameter as the denominator. Multiphase CT imaging of the brain was performed following IV bolus contrast injection. Subsequent parametric perfusion maps were calculated using RAPID software. CONTRAST:  OMNIPAQUE IOHEXOL 350 MG/ML SOLN COMPARISON:  None. FINDINGS: CTA NECK FINDINGS Aortic arch: Great vessels are patent. Right carotid system: No evidence of dissection, stenosis (50% or greater) or occlusion. Left carotid system: No evidence of dissection, stenosis (50% or greater) or occlusion. Vertebral arteries: Right dominant. No evidence of dissection, stenosis (50% or greater) or occlusion. Skeleton: No evidence of acute abnormality. Vertebral body heights are maintained. Other neck: No mass or suspicious adenopathy. Upper chest: Visualized lung apices are clear. Review of the MIP images confirms the above findings CTA HEAD FINDINGS Anterior circulation: No large vessel occlusion, proximal hemodynamically significant stenosis, or aneurysm. Posterior circulation: Severe stenosis of the right distal P2 PCA (see series 7, image 102; series 10, image 21; and series 8, images 113 through 119). Otherwise, no significant stenosis or aneurysm. Venous sinuses: As permitted by contrast timing, patent. Review of the MIP images confirms the above findings CT Brain Perfusion Findings: ASPECTS: 10. CBF (<30%) Volume: 5mL Perfusion (Tmax>6.0s) volume: 71mL Mismatch Volume: 59mL Infarction Location:None IMPRESSION: 1. 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 2. Otherwise, no significant stenosis or large vessel occlusion. 3. Normal perfusion study. Findings discussed with Dr. Napoleon Form via telephone at 11:51 a.m.  Electronically Signed   By: Feliberto Harts MD   On: 05/03/2020 11:57   CT Code Stroke CTA Neck W/WO contrast  Result Date: 05/03/2020 CLINICAL DATA:  Neuro deficit, acute stroke suspected. EXAM: CT ANGIOGRAPHY HEAD AND NECK CT PERFUSION BRAIN TECHNIQUE: Multidetector CT imaging of the head and neck was performed using the standard protocol during bolus administration of intravenous contrast. Multiplanar CT image reconstructions and MIPs were obtained to evaluate the vascular anatomy. Carotid stenosis measurements (when applicable) are obtained utilizing NASCET criteria, using the distal internal carotid diameter as the denominator. Multiphase CT imaging of the brain was performed following IV bolus contrast injection. Subsequent parametric perfusion maps were calculated using RAPID software. CONTRAST:  OMNIPAQUE IOHEXOL 350 MG/ML SOLN COMPARISON:  None. FINDINGS: CTA NECK FINDINGS Aortic arch: Great vessels are patent. Right carotid system: No evidence of dissection, stenosis (50% or greater) or occlusion. Left carotid system: No evidence of dissection, stenosis (50% or greater) or occlusion. Vertebral arteries: Right dominant. No evidence of dissection, stenosis (50% or greater) or occlusion. Skeleton: No evidence of acute abnormality. Vertebral body heights are maintained. Other neck: No mass or suspicious adenopathy. Upper chest: Visualized lung apices are clear. Review of the MIP images confirms the above findings CTA HEAD FINDINGS Anterior circulation: No large vessel occlusion, proximal hemodynamically significant stenosis, or aneurysm. Posterior circulation: Severe stenosis of the right distal P2 PCA (see series 7, image 102; series 10, image 21; and series 8, images 113 through 119). Otherwise, no significant stenosis or aneurysm. Venous sinuses: As permitted by contrast timing, patent. Review of the MIP images confirms the above findings CT Brain Perfusion Findings: ASPECTS: 10. CBF (<30%)  Volume: 38mL Perfusion (Tmax>6.0s) volume: 32mL Mismatch Volume: 43mL Infarction Location:None IMPRESSION: 1. 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 2. Otherwise, no significant stenosis or large vessel occlusion. 3. Normal perfusion study. Findings discussed with Dr. Napoleon Form via telephone at 11:51 a.m. Electronically Signed   By: Feliberto Harts MD   On: 05/03/2020 11:57   CT  Code Stroke Cerebral Perfusion with contrast  Result Date: 05/03/2020 CLINICAL DATA:  Neuro deficit, acute stroke suspected. EXAM: CT ANGIOGRAPHY HEAD AND NECK CT PERFUSION BRAIN TECHNIQUE: Multidetector CT imaging of the head and neck was performed using the standard protocol during bolus administration of intravenous contrast. Multiplanar CT image reconstructions and MIPs were obtained to evaluate the vascular anatomy. Carotid stenosis measurements (when applicable) are obtained utilizing NASCET criteria, using the distal internal carotid diameter as the denominator. Multiphase CT imaging of the brain was performed following IV bolus contrast injection. Subsequent parametric perfusion maps were calculated using RAPID software. CONTRAST:  OMNIPAQUE IOHEXOL 350 MG/ML SOLN COMPARISON:  None. FINDINGS: CTA NECK FINDINGS Aortic arch: Great vessels are patent. Right carotid system: No evidence of dissection, stenosis (50% or greater) or occlusion. Left carotid system: No evidence of dissection, stenosis (50% or greater) or occlusion. Vertebral arteries: Right dominant. No evidence of dissection, stenosis (50% or greater) or occlusion. Skeleton: No evidence of acute abnormality. Vertebral body heights are maintained. Other neck: No mass or suspicious adenopathy. Upper chest: Visualized lung apices are clear. Review of the MIP images confirms the above findings CTA HEAD FINDINGS Anterior circulation: No large vessel occlusion, proximal hemodynamically significant stenosis, or aneurysm. Posterior circulation: Severe stenosis of the  right distal P2 PCA (see series 7, image 102; series 10, image 21; and series 8, images 113 through 119). Otherwise, no significant stenosis or aneurysm. Venous sinuses: As permitted by contrast timing, patent. Review of the MIP images confirms the above findings CT Brain Perfusion Findings: ASPECTS: 10. CBF (<30%) Volume: 75mL Perfusion (Tmax>6.0s) volume: 48mL Mismatch Volume: 57mL Infarction Location:None IMPRESSION: 1. 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 2. Otherwise, no significant stenosis or large vessel occlusion. 3. Normal perfusion study. Findings discussed with Dr. Napoleon Form via telephone at 11:51 a.m. Electronically Signed   By: Feliberto Harts MD   On: 05/03/2020 11:57   CT HEAD CODE STROKE WO CONTRAST  Result Date: 05/03/2020 CLINICAL DATA:  Code stroke. Neuro deficit, acute stroke suspected. Right-sided numbness and weakness. EXAM: CT HEAD WITHOUT CONTRAST TECHNIQUE: Contiguous axial images were obtained from the base of the skull through the vertex without intravenous contrast. COMPARISON:  CT head May 27, 2015. FINDINGS: Brain: No evidence of acute large vascular territory infarction, hemorrhage, hydrocephalus, extra-axial collection or mass lesion/mass effect. Vascular: No hyperdense vessel identified. Skull: No acute fracture. Sinuses/Orbits: Mild mucosal thickening without air-fluid levels. Unremarkable orbits. Other: No mastoid effusions. ASPECTS [facility] Stroke Program Early CT Score) Total score (0-10 with 10 being normal): 10. IMPRESSION: 1. No evidence of acute intracranial abnormality. 2. ASPECTS is 10. Code stroke imaging results were communicated on 05/03/2020 at 11:34 am to provider Dr. Napoleon Form Via telephone, who verbally acknowledged these results. Electronically Signed   By: Feliberto Harts MD   On: 05/03/2020 11:38    Procedures Procedures  CRITICAL CARE Performed by: Wynetta Fines   Total critical care time: 30 minutes  Critical care time was exclusive of  separately billable procedures and treating other patients.  Critical care was necessary to treat or prevent imminent or life-threatening deterioration.  Critical care was time spent personally by me on the following activities: development of treatment plan with patient and/or surrogate as well as nursing, discussions with consultants, evaluation of patient's response to treatment, examination of patient, obtaining history from patient or surrogate, ordering and performing treatments and interventions, ordering and review of laboratory studies, ordering and review of radiographic studies, pulse oximetry  and re-evaluation of patient's condition.   Medications Ordered in ED Medications  sodium chloride flush (NS) 0.9 % injection 3 mL (3 mLs Intravenous Not Given 05/03/20 1223)  iohexol (OMNIPAQUE) 350 MG/ML injection 100 mL (100 mLs Intravenous Contrast Given 05/03/20 1138)    ED Course  I have reviewed the triage vital signs and the nursing notes.  Pertinent labs & imaging results that were available during my care of the patient were reviewed by me and considered in my medical decision making (see chart for details).    MDM Rules/Calculators/A&P                          MDM  Screen complete  CANIYAH MURLEY was evaluated in Emergency Department on 05/03/2020 for the symptoms described in the history of present illness. She was evaluated in the context of the global COVID-19 pandemic, which necessitated consideration that the patient might be at risk for infection with the SARS-CoV-2 virus that causes COVID-19. Institutional protocols and algorithms that pertain to the evaluation of patients at risk for COVID-19 are in a state of rapid change based on information released by regulatory bodies including the CDC and federal and state organizations. These policies and algorithms were followed during the patient's care in the ED.  Patient is presenting for evaluation of reported right arm  weakness.  Patient's initial work-up is not suggestive of significant acute neurologic pathology.  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  MRI pending.  Patient will require medicine admission for further work-up and treatment.  Patient may require psychiatric evaluation pending MRI results.  Teaching team is aware of case and will evaluate for admission.   Final Clinical Impression(s) / ED Diagnoses Final diagnoses:  Weakness    Rx / DC Orders ED Discharge Orders    None       Wynetta Fines, MD 05/03/20 1242

## 2020-05-03 NOTE — ED Notes (Signed)
Pt returned from MRI °

## 2020-05-03 NOTE — Hospital Course (Addendum)
Michelle Lucas is a 30 y.o. female who presented with right sided weakness and diminished sensation likely due to conversion disorder.  PMH is significant for Migraines, Anxiety/Depression/Adjustment disorder, Pseudoseizures and Asthma.   Right Side Weakness likely due to conversion disorder Patient presented to ED with right-sided numbness and weakness of face, arm, leg starting midnight prior to admission.  Patient cannot recall having these symptoms previously; however, was seen in the ED on 12/20/2019 for seizure-like activity. Does endorse her migraines occasionally causing hand weakness. 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 Brain MRI with no visible acute intracranial abnormality, though limited due to artifact from patient's braces. CT head without evidence of acute intracranial abnormality. UDS positive only for THC. Hgb normal. CMP remarkable only for BUN <5 and total bilirubin 1.7.  CBC unremarkable.  Admission physical exam with no cranial nerve deficits, and with R sided arm and leg weakness and diminished sensation of R side of face, arm, and leg.  Differential diagnosis included migraines, magnesium deficiency, B12 deficiency, conversion disorder, or syphilis.  Magnesium 1.8, vitamin B12 211.  TSH normal at 1.584. HIV negative, RPR negative***.  Neuro was consulted but signed off.  Psychiatry consulted and recommended ***.  By day of discharge, patient symptoms had *** resolved.  Patient discharged on new medications of *** and ***.    Pseudoseizures Per chart review last seen in ED for seizure like activity 12/20/2019.      Other chronic conditions stable throughout admission: Migraines Asthma Anxiety Depression Hypertension Marijuana use  Discharge recommendations: Neuro outpatient f/u

## 2020-05-03 NOTE — H&P (Addendum)
[facility] Admission History and Physical Service Pager: [phone]  Patient name: Michelle Lucas Harmon Medical record number: [id number] Date of birth: August 24, 1990 Age: 30 y.o. Gender: female  Primary Care Provider: Patient, No Pcp Per Consultants: Neuro Code Status: Full Preferred Emergency Contact: Lenise ArenaChristopher Jackson (husband) [phone]  Chief Complaint: Right sided weakness and numbness  Assessment and Plan: Michelle Lucas Grippi is a 30 y.o. female presenting with right sided weakness and diminished sensation. PMH is significant for Migraines, Anxiety/Depression/Adjustment disorder, Pseudoseizures and Asthma.  Right Side Weakness Endorses right-sided face, upper, and lower extremity numbness and weakness that started at midnight. Has had similar symptoms in the past per note 12/20/19, though she denies having these symptoms previously. Does endorse her migraines occasionally causing hand weakness. 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 Brain MRI with no visible acute intracranial abnormality, though limited due to artifact from patient's braces. CT head without evidence of acute intracranial abnormality. UDS positive for THC. Hgb normal. CMP and CBC pending. Physical exam with no cranial nerve deficits, and with R sided arm and leg weakness and diminished sensation of R side of face, arm, and leg.  Weakness and decreased sensation could be caused by her migraines, or due to magnesium deficiency, Vit B12 deficiency or syphilis, although not as likely due to unilateral nature of symptoms. Considering psychiatric causes such as conversion syndrome as well. Neuro was consulted and signed off. - admit to FPTS, med-surg, attending Dr. Miquel DunnPray - CMP, CBC, Vit B12, RPR, TSH, Mag - vitals per routine - cardiac monitoring for 12 hours - fall and seizure precautions  - PT/OT - consider psych consult  - Incentive  spirometer   Pseudoseizures Per chart review last seen in ED for seizure like activity 12/20/2019.    Migraines Denies taking medication. Usually resolves with turning off the light. Does have bilateral hand weakness when these migraines occur.    Asthma Home medication Albuterol. Reports taking monthly -Continue home Albuterol prn  Anxiety/Depression Denies medication or therapy  - can counsel on both of these options   HTN  Was on atenolol.  Stopped taking it in foster care years ago. -Continue to monitor  Substance Use Endorses smoking marijuana (1/2 blunt) every other week. - counsel on smoking cessation     Dispo: Likely 1 or 2 days pending continued normal work up  FEN/GI: Regular diet Prophylaxis: Lovenox  Disposition: Home  History of Present Illness:  Michelle Lucas Schatzman is a 30 y.o. female presenting with right side weakness.  She was last seen normal yesterday before midnight. Started feeling unwell yesterday at 12 am.  Nausea and sweating.  Passed out and doesn't remember anything until this morning. When she woke up she noticed right sided face, arm, and right leg weakness and decreased sensation. Also endorses slurred speech this morning that has now resolved, as well as Visual disturbances, blurred vision out of right eye, peripherally.  . Does endorse a migraine yesterday prior to the onset of symptoms. States in the past she would have bilateral hand weakness with her migraines but they were different from her current symptoms.   States she was diagnosed with a heart murmur at age 30, denies any other cardiac history History of anxiety and depression. Endorses having a history of multiple panic attacks. Denies medication or therapy and "just deals with it."   THC use once every other week, half blunt.  Last  used 2 days ago. Denies tobacco, alcohol, cocaine use and denies any other recreational drug besides marijuana.   LMP 04/24/20  Review Of Systems: Per HPI with  the following additions:   Review of Systems  Constitutional: Positive for chills and fever. Negative for appetite change.  HENT: Positive for hearing loss.   Eyes: Positive for visual disturbance.  Respiratory: Negative for cough and chest tightness.   Cardiovascular: Negative for chest pain and leg swelling.  Gastrointestinal: Positive for nausea. Negative for abdominal pain, constipation and diarrhea.  Genitourinary: Negative for difficulty urinating and vaginal bleeding.  Neurological: Positive for syncope, speech difficulty, weakness and headaches. Negative for seizures.  Hematological: Does not bruise/bleed easily.  Psychiatric/Behavioral: Negative for suicidal ideas.     Patient Active Problem List   Diagnosis Date Noted  . Right sided weakness 05/03/2020  . Hidradenitis suppurativa 04/06/2019  . Adjustment disorder with disturbance of conduct 10/18/2016    Past Medical History: Past Medical History:  Diagnosis Date  . Anxiety attack    does not take meds  . Asthma    PRN inhaler   . Heart disease   . Hx of gonorrhea 05/2017  . Major depression   . Migraines   . Pseudoseizures [facility])     Past Surgical History: No past surgical history on file.  Social History: Social History   Tobacco Use  . Smoking status: Former Smoker    Types: Cigarettes    Quit date: 03/19/2015    Years since quitting: 5.1  . Smokeless tobacco: Never Used  Vaping Use  . Vaping Use: Never used  Substance Use Topics  . Alcohol use: Not Currently  . Drug use: Not Currently    Types: Marijuana    Comment: every once in a while - 5-6 years ago    Additional social history:   Please also refer to relevant sections of EMR.  Family History: Family History  Problem Relation Age of Onset  . Depression Mother   . Hypertension Mother   . Depression Father   . Hypertension Father   . Depression Sister   . Depression Brother   . Hypertension Maternal Aunt   . COPD Maternal Uncle   .  Hypertension Maternal Uncle   . Hypertension Maternal Grandmother   . Breast cancer Maternal Grandmother   . Hypertension Maternal Grandfather   . Lung cancer Maternal Grandfather        Family history  . Heart disease Maternal Grandfather   . Asthma Paternal Grandmother     Allergies and Medications: Allergies  Allergen Reactions  . Ciprofloxacin Hives  . Latex Swelling    Swelling per patient  . Penicillins Swelling    Swelling at injection site Has patient had a PCN reaction causing immediate rash, facial/tongue/throat swelling, SOB or lightheadedness with hypotension: Yes Has patient had a PCN reaction causing severe rash involving mucus membranes or skin necrosis: No Has patient had a PCN reaction that required hospitalization: No Has patient had a PCN reaction occurring within the last 10 years: Yes If all of the above answers are "NO", then may proceed with Cephalosporin use.   . Shrimp [Shellfish Allergy]    Current Facility-Administered Medications on File Prior to Encounter  Medication Dose Route Frequency Provider Last Rate Last Admin  . medroxyPROGESTERone (DEPO-PROVERA) injection 150 mg  150 mg Intramuscular Q90 days Hampton, Carla J, PA   150 mg at 12/16/19 1029   Current Outpatient Medications on File Prior to Encounter  Medication  Sig Dispense Refill  . albuterol (VENTOLIN HFA) 108 (90 Base) MCG/ACT inhaler Inhale 2 puffs into the lungs every 6 (six) hours as needed for wheezing or shortness of breath. 8 g 2    Objective: BP (!) 134/119   Pulse 72   Temp 98.2 F (36.8 C) (Oral)   Resp 16   Ht 5\' 5"  (1.651 m)   Wt 96.2 kg   SpO2 100%   BMI 35.29 kg/m  Exam: General: alert, cooperative, laying in bed on phone, NAD  Eyes: pupils equal and reactive to light ENTM: decreased hearing with finger rub test on R side Cardiovascular: RRR no murmurs Respiratory: CTAB. Normal WOB Gastrointestinal: abdomen soft, non distended, non tender MSK: Strength 4/5 R  upper and lower extremities. Reduced grip strength  R ankle inverted as seen in image below  Derm: skin warm, dry. no visible skin lesions  Neuro: CN II-XII in tact  Psych: alert, oriented. Appropriate mood and affect. Denies SI/HI        Labs and Imaging: CBC BMET  Recent Labs  Lab 05/03/20 1120 05/03/20 1135  WBC 6.1  --   HGB 12.9 13.9  HCT 39.0 41.0  PLT 296  --    Recent Labs  Lab 05/03/20 1120 05/03/20 1135  NA 138 139  K 3.8 3.9  CL 106 103  CO2 22  --   BUN <5* 4*  CREATININE 0.82 0.80  GLUCOSE 90 85  CALCIUM 9.3  --      EKG: My own interpretation (not copied from electronic read) normal sinus rhythm    CT Code Stroke CTA Head Lucas/WO contrast  1. 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 2. Otherwise, no significant stenosis or large vessel occlusion. 3. Normal perfusion study. Findings discussed with Dr. 05/05/20 via telephone at 11:51 a.m. Electronically Signed   By: Napoleon Form MD   On: 05/03/2020 11:57   CT Code Stroke CTA Neck Lucas/WO contrast  1. 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 2. Otherwise, no significant stenosis or large vessel occlusion. 3. Normal perfusion study. Findings discussed with Dr. 05/05/2020 via telephone at 11:51 a.m. Electronically Signed   By: Napoleon Form MD   On: 05/03/2020 11:57   MR BRAIN WO CONTRAST Artifact from the patient's braces significantly limits evaluation and obscures a large portion of the brain on diffusion and susceptibility imaging, as detailed above. Within this limitation, no visible acute intracranial abnormality. Electronically Signed   By: 05/05/2020 MD   On: 05/03/2020 14:42   CT Code Stroke Cerebral Perfusion with contrast 1. 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 2. Otherwise, no significant stenosis or large vessel occlusion. 3. Normal perfusion study. Findings discussed with Dr. 05/05/2020 via telephone at 11:51 a.m. Electronically Signed   By: Napoleon Form MD    On: 05/03/2020 11:57   CT HEAD CODE STROKE WO CONTRAST  1. No evidence of acute intracranial abnormality. 2. ASPECTS is 10. Code stroke imaging results were communicated on 05/03/2020 at 11:34 am to provider Dr. 05/05/2020 Via telephone, who verbally acknowledged these results. Electronically Signed   By: Napoleon Form MD   On: 05/03/2020 11:38    05/05/2020, DO 05/03/2020, 3:00 PM PGY-1, [facility] FPTS Intern pager: [phone], text pages welcome  FPTS Upper-Level Resident Addendum   I have independently interviewed and examined the patient. I have discussed the above with the original author and agree with  their documentation.  Please see also any attending notes.    Dana Allan MD PGY-2, [facility] 05/03/2020 8:25 PM  FPTS Service pager: [phone] (text pages welcome through [facility])

## 2020-05-04 ENCOUNTER — Inpatient Hospital Stay (HOSPITAL_COMMUNITY): Payer: Self-pay

## 2020-05-04 LAB — BASIC METABOLIC PANEL
Anion gap: 10 (ref 5–15)
BUN: 5 mg/dL — ABNORMAL LOW (ref 6–20)
CO2: 23 mmol/L (ref 22–32)
Calcium: 9.3 mg/dL (ref 8.9–10.3)
Chloride: 104 mmol/L (ref 98–111)
Creatinine, Ser: 0.85 mg/dL (ref 0.44–1.00)
GFR, Estimated: >60 mL/min (ref >60)
Glucose, Bld: 87 mg/dL (ref 70–99)
Potassium: 3.2 mmol/L — ABNORMAL LOW (ref 3.5–5.1)
Sodium: 137 mmol/L (ref 135–145)

## 2020-05-04 LAB — CBC
HCT: 38.9 % (ref 36.0–46.0)
Hemoglobin: 12.2 g/dL (ref 12.0–15.0)
MCH: 27.1 pg (ref 26.0–34.0)
MCHC: 31.4 g/dL (ref 30.0–36.0)
MCV: 86.3 fL (ref 80.0–100.0)
Platelets: 277 10*3/uL (ref 150–400)
RBC: 4.51 MIL/uL (ref 3.87–5.11)
RDW: 14.6 % (ref 11.5–15.5)
WBC: 5.8 10*3/uL (ref 4.0–10.5)
nRBC: 0 % (ref 0.0–0.2)

## 2020-05-04 LAB — RPR: RPR Ser Ql: NONREACTIVE

## 2020-05-04 LAB — PHOSPHORUS: Phosphorus: 4.8 mg/dL — ABNORMAL HIGH (ref 2.5–4.6)

## 2020-05-04 MED ORDER — ASPIRIN 81 MG PO CHEW
81.0000 mg | CHEWABLE_TABLET | Freq: Every day | ORAL | Status: DC
  Administered 2020-05-04 – 2020-05-05 (×2): 81 mg via ORAL
  Filled 2020-05-04 (×2): qty 1

## 2020-05-04 MED ORDER — POTASSIUM CHLORIDE CRYS ER 20 MEQ PO TBCR
40.0000 meq | EXTENDED_RELEASE_TABLET | Freq: Once | ORAL | Status: AC
  Administered 2020-05-04: 40 meq via ORAL
  Filled 2020-05-04: qty 2

## 2020-05-04 NOTE — Evaluation (Signed)
Occupational Therapy Evaluation Patient Details Name: Michelle Lucas MRN: [id number] DOB: 1990-08-18 Today's Date: 05/04/2020    History of Present Illness Pt is a 30 y.o. female who presents with R-sided weakness and numbness. Per chart review, she has had these symptoms prior on 12/20/19 but denied them. UDS positive for THC. 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 Possible causes considered at this time are migraines, magnesium deficiency, B12 deficiency or syphilis, or psychaitric causes like conversion syndrome. PMH: migraines, heart murmur, anxiety, depression, asthma, and pseudoseizures.   Clinical Impression   PTA, pt was living with her roommate and was independent and working full time. Pt currently requiring Min Guard-Min A for LB ADLs and Min Guard A for functional mobility with RW. Pt performing grooming and incorporating RUE with increased time and effort; utilizing LUE more for brushing teeth. Pt also presenting with pain at R shoulder during PROM past 90* flexion; noting tightness at R traps and pain with palpation. Pt would benefit from further acute OT to facilitate safe dc. Recommend dc to CIR for further OT to optimize safety, independence with ADLs, and return to PLOF.     Follow Up Recommendations  CIR (May progress to home with OP)    Equipment Recommendations  Other (comment) (Defer to next venue)    Recommendations for Other Services PT consult     Precautions / Restrictions Precautions Precautions: Fall Restrictions Weight Bearing Restrictions: No      Mobility Bed Mobility Overal bed mobility: Needs Assistance Bed Mobility: Supine to Sit     Supine to sit: Supervision     General bed mobility comments: In recliner upon arrival    Transfers Overall transfer level: Needs assistance Equipment used: Rolling walker (2 wheeled) Transfers: Sit  to/from Stand Sit to Stand: Min guard Stand pivot transfers: Min assist       General transfer comment: MIn Guard A for safety. Increased time    Balance Overall balance assessment: Needs assistance Sitting-balance support: No upper extremity supported;Feet supported Sitting balance-Leahy Scale: Good Sitting balance - Comments: Able to reach off BOS to donn socks without LOB, placing bil feet on contralateral thigh, supervision for safety.   Standing balance support: No upper extremity supported;During functional activity Standing balance-Leahy Scale: Fair Standing balance comment: Able to stand without UE support                           ADL either performed or assessed with clinical judgement   ADL Overall ADL's : Needs assistance/impaired Eating/Feeding: Set up;Sitting   Grooming: Oral care;Supervision/safety;Set up;Standing Grooming Details (indicate cue type and reason): Supervision for safety. Pt using LUE to brush teeth. Incorporating RUE into opening toothbrush wrapper nad toothpaste Upper Body Bathing: Supervision/ safety;Set up;Sitting   Lower Body Bathing: Min guard;Sit to/from stand   Upper Body Dressing : Set up;Supervision/safety;Sitting   Lower Body Dressing: Minimal assistance;Sit to/from stand   Toilet Transfer: Min guard;Ambulation (simulated to recliner)           Functional mobility during ADLs: Min guard;Rolling walker General ADL Comments: Pt performing grooming at sink and functional mobiltiy at [employer] A level. Significant time as pt moving slowly.     Vision   Vision Assessment?: Yes Eye Alignment: Within Functional Limits Ocular Range of Motion: Within Functional Limits Alignment/Gaze Preference: Within Defined Limits Tracking/Visual Pursuits: Able  to track stimulus in all quads without difficulty Convergence: Within functional limits Additional Comments: Reports limited vision at R peripheral     Perception      Praxis      Pertinent Vitals/Pain Pain Assessment: Faces Pain Score: 8  Faces Pain Scale: Hurts even more Pain Location: R shoulder Pain Descriptors / Indicators: Discomfort;Grimacing;Guarding Pain Intervention(s): Monitored during session;Limited activity within patient's tolerance;Repositioned     Hand Dominance Right   Extremity/Trunk Assessment Upper Extremity Assessment Upper Extremity Assessment: RUE deficits/detail RUE Deficits / Details: Pt with inconsistent presentation. Pt able to open toothbrush wrapper and toothpaste with increased time; demonstrating WFL for pinch and grasp strength. However, utilizing her L hand to brush teeth. during manual muscle testing, pt with weakness at RUE for grasp and gross strength; however, noting increased engagement during "pushing" with increased resistence applied. Pt abel to perform finger opposition with increased time. During AROM of shoulder, abel to perform 0-90. At 90, pt with increased pain for PROM. Noting pt with tightness at R traps and painful for manual therapy and palpation. RUE Coordination: decreased gross motor   Lower Extremity Assessment Lower Extremity Assessment: Defer to PT evaluation RLE Deficits / Details: MMT scores of the following: hip flexion 3-, knee extension 3+, knee flexion 3, ankle dorsiflexion/eversion 2+; holds R ankle in inversion at all times (inconsistencies noted: MMT pt unable to full AROM slowly but able to do quick and adequate hip flexion to clear feet when bedside table in front of her, per RN report once PT left room, and adequate but slow hip flexion to donn socks) RLE Sensation: decreased light touch;decreased proprioception (pt quickly verbalizes no sensation to light touch, very deep pressure, and no dynamic proprioception with eyes closed, but pt does keep feet in sync with feet taps and lifts legs appropriately in response to clear feet for table; 2x response to tickle) RLE Coordination:  decreased fine motor;decreased gross motor   Cervical / Trunk Assessment Cervical / Trunk Assessment: Normal   Communication Communication Communication: Expressive difficulties   Cognition Arousal/Alertness: Awake/alert Behavior During Therapy: WFL for tasks assessed/performed Overall Cognitive Status: No family/caregiver present to determine baseline cognitive functioning                                 General Comments: Oriented to place, time, and self; upon arrival, on the phone with sister and telling her that she had a stroke and is in the hospital despite earlier conversation with MD informating she didnt have a stroke. Pt answering conversation. Performing ADLs and conversation with increased time. Slow speech pattern.   General Comments  Stiffness with PROM at R ankle into eversion/dorsiflexion; no clonus or spasticity noted in legs; educated pt on continuing to move legs and stretch R ankle    Exercises     Shoulder Instructions      Home Living Family/patient expects to be discharged to:: Private residence Living Arrangements: Non-relatives/Friends (Ex boyfriend/roommate) Available Help at Discharge: Family;Friend(s);Available 24 hours/day Type of Home: House Home Access: Stairs to enter [employer] of Steps: 5 Entrance Stairs-Rails: Can reach both Home Layout: One level     Bathroom Shower/Tub: Chief Strategy Officer: Standard     Home Equipment: None   Additional Comments: Fiance travels for work      Prior Functioning/Environment Level of Independence: Independent        Comments: Works at a [employer]  full-time, lifting objects and doing computer work. Pt receives transportation from father and roommate.        OT Problem List: Decreased activity tolerance;Decreased strength;Decreased range of motion;Impaired balance (sitting and/or standing);Decreased knowledge of use of DME or AE;Decreased knowledge of  precautions;Decreased safety awareness;Decreased coordination;Decreased cognition;Impaired UE functional use      OT Treatment/Interventions: Self-care/ADL training;Therapeutic exercise;Energy conservation;DME and/or AE instruction;Therapeutic activities;Patient/family education    OT Goals(Current goals can be found in the care plan section) Acute Rehab OT Goals Patient Stated Goal: to go home with her dad's help OT Goal Formulation: With patient Time For Goal Achievement: 05/18/20 Potential to Achieve Goals: Good  OT Frequency: Min 3X/week   Barriers to D/C:            Co-evaluation              AM-PAC OT "6 Clicks" Daily Activity     Outcome Measure Help from another person eating meals?: None Help from another person taking care of personal grooming?: A Little Help from another person toileting, which includes using toliet, bedpan, or urinal?: A Little Help from another person bathing (including washing, rinsing, drying)?: A Little Help from another person to put on and taking off regular upper body clothing?: A Little Help from another person to put on and taking off regular lower body clothing?: A Little 6 Click Score: 19   End of Session Equipment Utilized During Treatment: Rolling walker Nurse Communication: Mobility status  Activity Tolerance: Patient tolerated treatment well Patient left: in chair;with call bell/phone within reach  OT Visit Diagnosis: Unsteadiness on feet (R26.81);Other abnormalities of gait and mobility (R26.89);Muscle weakness (generalized) (M62.81)                Time: 1607-3710 OT Time Calculation (min): 19 min Charges:  OT General Charges $OT Visit: 1 Visit OT Evaluation $OT Eval Moderate Complexity: 1 Mod  Izabella Marcantel MSOT, OTR/L Acute Rehab Pager: [phone] Office: [phone]   Theodoro Grist Jusiah Aguayo 05/04/2020, 11:46 AM

## 2020-05-04 NOTE — Progress Notes (Signed)
Inpatient Rehab Admissions Coordinator Note:   Per therapy recommendations, pt was screened for CIR candidacy by Estill Dooms, PT, DPT.  At this time we would not have a bed for this patient to admit before mid week next week.  I will follow from a distance for progress, but expect that by the time a bed becomes available she would be able to discharge hom.  Please contact me with questions.   Estill Dooms, PT, DPT [phone] 05/04/20 1:56 PM

## 2020-05-04 NOTE — TOC CAGE-AID Note (Signed)
Transition of Care [facility]) - CAGE-AID Screening   Patient Details  Name: Michelle Lucas MRN: [id number] Date of Birth: [birth year]  Transition [facility]) CM/SW Contact:    Kermit Balo, RN Phone Number: 05/04/2020, 1:05 PM   Clinical Narrative: Pt states she uses THC to help with her seizure spells. She refused resources for inpatient/ outpatient counseling.   CAGE-AID Screening:    Have You Ever Felt You Ought to Cut Down on Your Drinking or Drug Use?: No Have People Annoyed You By Critizing Your Drinking Or Drug Use?: No Have You Felt Bad Or Guilty About Your Drinking Or Drug Use?: No Have You Ever Had a Drink or Used Drugs First Thing In The Morning to Steady Your Nerves or to Get Rid of a Hangover?: No CAGE-AID Score: 0  Substance Abuse Education Offered: Yes (Pt refused)

## 2020-05-04 NOTE — TOC Initial Note (Signed)
Transition of Care [facility]) - Initial/Assessment Note    Patient Details  Name: Michelle Lucas MRN: [id number] Date of Birth: 02-08-1991  Transition of Care [facility]) CM/SW Contact:    Kermit Balo, RN Phone Number: 05/04/2020, 1:07 PM  Clinical Narrative:                 Pt lives with a roommate that works but pt states he is at the home most of the time. Pt denies issues with transportation.  She doesn't have insurance or a PCP. CM will provide information on clinics in [place] area that she can call and schedule an appt and receive medication assistance. Recommendations are for CIR. CM following for d/c disposition and needs. Pt will need medication assistance at d/c depending on meds ordered.   Expected Discharge Plan: IP Rehab Facility Barriers to Discharge: Continued Medical Work up,Inadequate or no insurance   Patient Goals and CMS Choice     Choice offered to / list presented to : Patient  Expected Discharge Plan and Services Expected Discharge Plan: IP Rehab Facility   Discharge Planning Services: CM Consult Post Acute Care Choice: IP Rehab Living arrangements for the past 2 months: Apartment                                      Prior Living Arrangements/Services Living arrangements for the past 2 months: Apartment Lives with:: Roommate Patient language and need for interpreter reviewed:: No Do you feel safe going back to the place where you live?: Yes        Care giver support system in place?: Yes (comment) (Roommate is at the home most of the time)   Criminal Activity/Legal Involvement Pertinent to Current Situation/Hospitalization: No - Comment as needed  Activities of Daily Living      Permission Sought/Granted                  Emotional Assessment Appearance:: Appears stated age Attitude/Demeanor/Rapport: Engaged Affect (typically observed): Accepting Orientation: : Oriented to Self,Oriented to Place,Oriented to  Time,Oriented to  Situation Alcohol / Substance Use: Illicit Drugs Psych Involvement: No (comment)  Admission diagnosis:  Weakness [R53.1] Right sided weakness [R53.1] Patient Active Problem List   Diagnosis Date Noted  . Right sided weakness 05/03/2020  . Hidradenitis suppurativa 04/06/2019  . 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 [place] [address] Phone: [phone] Fax: [phone]     Social Determinants of Health (SDOH) Interventions    Readmission Risk Interventions No flowsheet data found.

## 2020-05-04 NOTE — Evaluation (Signed)
Physical Therapy Evaluation Patient Details Name: Michelle Lucas MRN: [id number] DOB: 05-10-1990 Today's Date: 05/04/2020   History of Present Illness  Pt is a 30 y.o. female who presents with R-sided weakness and numbness. Per chart review, she has had these symptoms prior on 12/20/19 but denied them. UDS positive for THC. 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 Possible causes considered at this time are migraines, magnesium deficiency, B12 deficiency or syphilis, or psychaitric causes like conversion syndrome. PMH: migraines, heart murmur, anxiety, depression, asthma, and pseudoseizures.  Clinical Impression   Pt presents with condition above and deficits mentioned below, see PT Problem List. PTA, she was living with a roommate who provided her with transportation, functioning independently with all mobility without AD/AE, and working full-time. She reports episodes similar to this previously and that her dad is going to come stay and help her at home now. She reports hx of R ankle sprains and having a boot provided to her at some point, but she is unable to remember when (possible memory deficits?). She also currently displays inconsistencies with her speech, responding quickly when assessing her sensation in her legs but more slowly when responding to verbal questions otherwise. Pt demonstrates R leg weakness, absent R sensation and dynamic proprioception, and R leg coordination deficits that place her at risk for falls. However, per RN report the pt was able to quickly lift her feet adequately and fairly symmetrically to clear her feet when pushing her bedside table in front of her when PT left, indicating some inconsistencies with sensation and proprioception. In addition, did note x2 slight reactions to tickling of her R foot. Pt's R ankle is stiff in inversion throughout eval though, placing  her at risk for inversion sprain injuries. She currently requires minA for transfers and short gait bouts with a RW. Secondary to pt's young age, PLOF differing greatly from her current level of function, and good social support recommending intensive therapies in the CIR setting to maximize her independence and safety with all functional mobility. Will continue to follow acutely.    Follow Up Recommendations CIR;Supervision for mobility/OOB    Equipment Recommendations  Rolling walker with 5" wheels;3in1 (PT) (may change with progression)    Recommendations for Other Services Rehab consult     Precautions / Restrictions Precautions Precautions: Fall Restrictions Weight Bearing Restrictions: No      Mobility  Bed Mobility Overal bed mobility: Needs Assistance Bed Mobility: Supine to Sit     Supine to sit: Supervision     General bed mobility comments: Pt able to come to sit R EOB with L bed rail and bed flat with extra time and effort, supervision for safety.    Transfers Overall transfer level: Needs assistance Equipment used: Rolling walker (2 wheeled) Transfers: Sit to/from [employer] Sit to Stand: Min assist Stand pivot transfers: Min assist       General transfer comment: Verbal ande visual demonstration of pushing up from bed then transitioning hands to RW to come to stand, pt expressed pain in R hand with pushing up thus placed R hand on RW prior to transfer. Extra time and minA to power up to stand and manage RW and prevent R ankle inversion sprain with stand step to R to chair. No LOB.  Ambulation/Gait Ambulation/Gait assistance: Min assist Gait Distance (Feet): 15 Feet Assistive device: Rolling walker (2 wheeled) Gait Pattern/deviations:  Step-through pattern;Decreased step length - right;Decreased stride length;Decreased dorsiflexion - right;Decreased weight shift to right Gait velocity: reduced Gait velocity interpretation: <1.31 ft/sec,  indicative of household ambulator General Gait Details: Ambulates with R ankle inversion throughout, needing intermittent physical assistance to block inversion sprain. No LOB, minA for safety. Provided tactile and verbal cues to relax R leg for improved R knee flexion with swing as she tends to hold it stiffly, no success.  Stairs            Wheelchair Mobility    Modified Rankin (Stroke Patients Only) Modified Rankin (Stroke Patients Only) Pre-Morbid Rankin Score: No symptoms Modified Rankin: Moderately severe disability     Balance Overall balance assessment: Needs assistance Sitting-balance support: No upper extremity supported;Feet supported Sitting balance-Leahy Scale: Normal Sitting balance - Comments: Able to reach off BOS to donn socks without LOB, placing bil feet on contralateral thigh, supervision for safety.   Standing balance support: Bilateral upper extremity supported Standing balance-Leahy Scale: Poor Standing balance comment: Reliant on UE support on RW.                             Pertinent Vitals/Pain Pain Assessment: 0-10 Pain Score: 8  Pain Location: R side Pain Descriptors / Indicators: Discomfort;Grimacing;Guarding Pain Intervention(s): Limited activity within patient's tolerance;Monitored during session;Repositioned    Home Living Family/patient expects to be discharged to:: Private residence Living Arrangements: Non-relatives/Friends (female roommate, he works and is not very strong per pt; father coming to live with her initially) Available Help at Discharge: Family;Friend(s);Available 24 hours/day Type of Home: House Home Access: Stairs to enter Entrance Stairs-Rails: Can reach both Entrance Stairs-Number of Steps: 5 Home Layout: One level Home Equipment: None Additional Comments: Pt has a spouse but he does not live with her, he travels for work.    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 Pt receives transportation from father and roommate.     Hand Dominance        Extremity/Trunk Assessment   Upper Extremity Assessment Upper Extremity Assessment: Defer to OT evaluation    Lower Extremity Assessment Lower Extremity Assessment: RLE deficits/detail RLE Deficits / Details: MMT scores of the following: hip flexion 3-, knee extension 3+, knee flexion 3, ankle dorsiflexion/eversion 2+; holds R ankle in inversion at all times (inconsistencies noted: MMT pt unable to full AROM slowly but able to do quick and adequate hip flexion to clear feet when bedside table in front of her, per RN report once PT left room, and adequate but slow hip flexion to donn socks) RLE Sensation: decreased light touch;decreased proprioception (pt quickly verbalizes no sensation to light touch, very deep pressure, and no dynamic proprioception with eyes closed, but pt does keep feet in sync with feet taps and lifts legs appropriately in response to clear feet for table; 2x response to tickle) RLE Coordination: decreased fine motor;decreased gross motor    Cervical / Trunk Assessment Cervical / Trunk Assessment: Normal  Communication   Communication: Expressive difficulties (slurred and slowed speech at times)  Cognition Arousal/Alertness: Awake/alert Behavior During Therapy: Flat affect Overall Cognitive Status: No family/caregiver present to determine baseline cognitive functioning  General Comments: A&Ox4, but slow to process and respond at times especially with determining exact date and location. Pt with possible memory deficits as unable to recall when she got a R boot and imaging for her R ankle sprains, unsure whether it was even in past year or months.      General Comments General comments (skin integrity, edema, etc.): Stiffness with PROM at R ankle into  eversion/dorsiflexion; no clonus or spasticity noted in legs; educated pt on continuing to move legs and stretch R ankle    Exercises     Assessment/Plan    PT Assessment Patient needs continued PT services  PT Problem List Decreased strength;Decreased range of motion;Decreased activity tolerance;Decreased balance;Decreased mobility;Decreased coordination;Decreased cognition;Decreased knowledge of use of DME;Decreased safety awareness;Decreased knowledge of precautions;Impaired sensation       PT Treatment Interventions Gait training;DME instruction;Stair training;Functional mobility training;Therapeutic activities;Therapeutic exercise;Balance training;Neuromuscular re-education;Cognitive remediation;Patient/family education    PT Goals (Current goals can be found in the Care Plan section)  Acute Rehab PT Goals Patient Stated Goal: to go home with her dad's help PT Goal Formulation: With patient Time For Goal Achievement: 05/18/20 Potential to Achieve Goals: Good    Frequency Min 4X/week   Barriers to discharge        Co-evaluation               AM-PAC PT "6 Clicks" Mobility  Outcome Measure Help needed turning from your back to your side while in a flat bed without using bedrails?: A Little Help needed moving from lying on your back to sitting on the side of a flat bed without using bedrails?: A Little Help needed moving to and from a bed to a chair (including a wheelchair)?: A Little Help needed standing up from a chair using your arms (e.g., wheelchair or bedside chair)?: A Little Help needed to walk in hospital room?: A Little Help needed climbing 3-5 steps with a railing? : A Lot 6 Click Score: 17    End of Session Equipment Utilized During Treatment: Gait belt Activity Tolerance: Patient tolerated treatment well Patient left: in chair;with call bell/phone within reach;with chair alarm set;with nursing/sitter in room Nurse Communication: Mobility status PT Visit  Diagnosis: Unsteadiness on feet (R26.81);Other abnormalities of gait and mobility (R26.89);Muscle weakness (generalized) (M62.81);Difficulty in walking, not elsewhere classified (R26.2);Other symptoms and signs involving the nervous system (R29.898)    Time: 3086-5784 PT Time Calculation (min) (ACUTE ONLY): 39 min   Charges:   PT Evaluation $PT Eval Moderate Complexity: 1 Mod PT Treatments $Gait Training: 8-22 mins $Therapeutic Activity: 8-22 mins        Raymond Gurney, PT, DPT Acute Rehabilitation Services  Pager: [phone] Office: [phone]   Jewel Baize 05/04/2020, 9:19 AM

## 2020-05-04 NOTE — Treatment Plan (Cosign Needed)
Neurology Treatment Plan  - MRI brain imaging personally reviewed with limitation due to patient braces and artifact but without visible acute intracranial abnormality. - Okay to continue ASA 81 mg daily - No further neurology recommendations at this time, defer further stroke work-up.  - Neurology will be available for further questions as needed   Treatment plan discussed with attending physician and he is in agreement.  Lanae Boast, AGACNP-BC [facility] Pager [phone]

## 2020-05-04 NOTE — Plan of Care (Signed)

## 2020-05-04 NOTE — Progress Notes (Signed)
Family Medicine Teaching Service Daily Progress Note Intern Pager: [phone]  Patient name: Michelle Lucas Medical record number: [id number] Date of birth: Jan 31, 1991 Age: 30 y.o. Gender: female  Primary Care Provider: Patient, No Pcp Per Consultants: Neuro  Code Status: Full  Pt Overview and Major Events to Date:  2/16 Admitted   Assessment and Plan: Michelle Lucas is a 30 y.o. female presenting with right sided weakness and diminished sensation r/o for stroke, potentially due to psychosomatic cause vs complex migraine. PMH is significant for Migraines, Anxiety/Depression/Adjustment disorder, Pseudoseizures and Asthma.  Right Side Weakness Endorses right-sided face, upper, and lower extremity numbness and weakness that started at midnight on 2/16. 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 Brain MRI with no visible acute intracranial abnormality, though limited due to artifact from patient's braces. CT head without evidence of acute intracranial abnormality. Hgb normal. Labs wnl. Physical exam with no cranial nerve deficits, and with R sided arm and leg weakness and diminished sensation of R side of face, arm, and leg.  Neuro was consulted and signed off. - vitals per routine - cardiac monitoring for 12 hours - fall and seizure precautions  - PT/OT - consider psych consult if neuro has no further recs    R foot inversion Patient endorses several weeks of this inversion. Denies pain or any inciting injury. No erythema or edema.  - x ray of R foot wnl  Hypokalemia K+ 3.2.   - repleated with KCl 40 mEq   Pseudoseizures Per chart review last seen in ED for seizure like activity 12/20/2019.    Migraines Denies taking medication. Usually resolves with turning off the light. Does have bilateral hand weakness when these migraines occur.    Asthma Home medication Albuterol. Reports taking monthly -Continue home  Albuterol prn  Anxiety/Depression Denies medication or therapy  - can counsel on both of these options and consider SSRI   HTN  BP stable at 120/77. Was previously on atenolol.  Stopped taking it in foster care years ago. -Continue to monitor  Substance Use Endorses smoking marijuana (1/2 blunt) every other week. - counsel on smoking cessation     Dispo: Likely 1 or 2 days pending continued normal work up  FEN/GI: Regular diet Prophylaxis: Lovenox  Disposition: d/c today pending continued normal labs   Subjective:  Patient endorses continued weakness and almost no sensation on the right side of her body. States her fingers turned purple over night but ended up resolving on its own.   Objective: Temp:  [98 F (36.7 C)-98.5 F (36.9 C)] 98.2 F (36.8 C) (02/17 0533) Pulse Rate:  [68-96] 68 (02/17 0533) Resp:  [13-18] 17 (02/17 0533) BP: (100-134)/(53-119) 111/65 (02/17 0533) SpO2:  [98 %-100 %] 100 % (02/17 0533) FiO2 (%):  [21 %] 21 % (02/16 1559) Weight:  [94.2 kg-99.1 kg] 99.1 kg (02/17 0500) Physical Exam: General: well appearing, NAD Cardiovascular: RRR no murmurs Respiratory: CTAB. Normal WOB Abdomen: soft, non distended, non tender  MSK: Strength 4/5 R upper and lower extremities. Reduced grip strength  R ankle inversion without erythema or edema Neuro: CN II- XII in tact, but with reduced sensation on R side of body that does cross midline  Psych: alert, oriented. Appropriate mood and affect  Laboratory: Recent Labs  Lab 05/03/20 1120 05/03/20 1135 05/04/20 0335  WBC 6.1  --  5.8  HGB 12.9 13.9 12.2  HCT 39.0 41.0 38.9  PLT 296  --  277   Recent Labs  Lab 05/03/20 1120 05/03/20 1135 05/04/20 0335  NA 138 139 137  K 3.8 3.9 3.2*  CL 106 103 104  CO2 22  --  23  BUN <5* 4* 5*  CREATININE 0.82 0.80 0.85  CALCIUM 9.3  --  9.3  PROT 7.4  --   --   BILITOT 1.7*  --   --   ALKPHOS 70  --   --   ALT 14  --   --   AST 22  --   --   GLUCOSE 90  85 87     Imaging/Diagnostic Tests:  DG Foot Complete Right  Result Date: 05/04/2020 CLINICAL DATA:  Internal rotation of right foot EXAM: RIGHT FOOT COMPLETE - 3+ VIEW COMPARISON:  None. FINDINGS: No acute bony abnormality. Specifically, no fracture, subluxation, or dislocation. Joint spaces maintained. Soft tissues are intact. IMPRESSION: Negative. Electronically Signed   By: Charlett Nose M.D.   On: 05/04/2020 11:59    Cora Collum, DO 05/04/2020, 7:45 AM PGY-1, [facility] FPTS Intern pager: [phone], text pages welcome

## 2020-05-05 MED ORDER — POTASSIUM CHLORIDE CRYS ER 20 MEQ PO TBCR
40.0000 meq | EXTENDED_RELEASE_TABLET | Freq: Once | ORAL | Status: AC
  Administered 2020-05-05: 40 meq via ORAL
  Filled 2020-05-05: qty 2

## 2020-05-05 NOTE — Progress Notes (Signed)
Patient discharged home via wheelchair with family via private vehicle.

## 2020-05-05 NOTE — Plan of Care (Signed)

## 2020-05-05 NOTE — Progress Notes (Signed)
Physical Therapy Treatment Patient Details Name: Michelle Lucas MRN: [id number] DOB: February 13, 1991 Today's Date: 05/05/2020    History of Present Illness Pt is a 30 y.o. female who presents with R-sided weakness and numbness. Per chart review, she has had these symptoms prior on 12/20/19 but denied them. UDS positive for THC. 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 Possible causes considered at this time are migraines, magnesium deficiency, B12 deficiency or syphilis, or psychaitric causes like conversion syndrome. PMH: migraines, heart murmur, anxiety, depression, asthma, and pseudoseizures.    PT Comments    Pt making significant progress with mobility, only needing min guard for transfers, ambulating up to ~ 175 ft, and negotiating 4 stairs with bil hands on one rail today. Pt's R knee flexion and dorsiflexion improved during swing allowing for improved heel strike when pt was challenged to increase her speed. However, she continues to display some R foot inversion throughout, placing her at risk for sprains. Pt did lead with L foot when descending 1 stair 1x, in which she displayed good R foot control and ROM to permit safe descent. Pt and pt's roommate educated on proper and safe guarding with gait and stairs and proper foot sequencing with stairs. Will continue to follow acutely. As pt has improved, changed recs to [facility] PT.   Follow Up Recommendations  Supervision for mobility/OOB;Home health PT     Equipment Recommendations  Rolling walker with 5" wheels (may change with progression)    Recommendations for Other Services       Precautions / Restrictions Precautions Precautions: Fall Restrictions Weight Bearing Restrictions: No    Mobility  Bed Mobility               General bed mobility comments: Pt sitting up in recliner upon arrival.    Transfers Overall transfer  level: Needs assistance Equipment used: Rolling walker (2 wheeled) Transfers: Sit to/from Stand Sit to Stand: Min guard         General transfer comment: Extra time and effort to power up to stand, cuing for hand and feet placement. Min guard for safety, no overt LOB.  Ambulation/Gait Ambulation/Gait assistance: Min guard Gait Distance (Feet): 175 Feet Assistive device: Rolling walker (2 wheeled) Gait Pattern/deviations: Step-through pattern;Decreased step length - right;Decreased stride length;Decreased dorsiflexion - right;Decreased weight shift to right;Trunk flexed Gait velocity: reduced Gait velocity interpretation: 1.31 - 2.62 ft/sec, indicative of limited community ambulator General Gait Details: Ambulates with R ankle inversion throughout, but the R leg internal rotation and excessive R knee extension with swing did improve intermittently for brief periods, especially when pt cued to increase speed. Improved heel strike on R intermittently also. No LOB, min guard for safety. Roommate provided chair follow and he was educated on safe guarding of pt with mobility.   Stairs Stairs: Yes Stairs assistance: Min guard Stair Management: One rail Right;One rail Left;Step to pattern Number of Stairs: 4 General stair comments: Step-to pattern, ascending with R rail and descending with L to simulate her mom's home in which she is planning to d/c to. Educated pt and her roommate on proper leading up with L and down with R leg and proper, safe guarding of pt with stairs. Roommate guarded pt final 2 stairs. 1x pt accidently lead down with L leg in which her R knee flexed and dorisflexed adequately for her to safely control her descent. No LOB,  min guard for safety.   Wheelchair Mobility    Modified Rankin (Stroke Patients Only) Modified Rankin (Stroke Patients Only) Pre-Morbid Rankin Score: No symptoms Modified Rankin: Moderately severe disability     Balance Overall balance  assessment: Needs assistance         Standing balance support: Bilateral upper extremity supported Standing balance-Leahy Scale: Poor Standing balance comment: Reliant on UE support on RW.                            Cognition Arousal/Alertness: Awake/alert Behavior During Therapy: WFL for tasks assessed/performed Overall Cognitive Status: No family/caregiver present to determine baseline cognitive functioning                                 General Comments: Increased time for all mobility. Slow speech at times.      Exercises      General Comments General comments (skin integrity, edema, etc.): Sensation may be improving as when bedside table bumped her R foot at end of session she did reactively lift it to clear it.      Pertinent Vitals/Pain Pain Assessment: No/denies pain Pain Intervention(s): Monitored during session    Home Living                      Prior Function            PT Goals (current goals can now be found in the care plan section) Acute Rehab PT Goals Patient Stated Goal: to improve PT Goal Formulation: With patient Time For Goal Achievement: 05/18/20 Potential to Achieve Goals: Good Progress towards PT goals: Progressing toward goals    Frequency    Min 4X/week      PT Plan Discharge plan needs to be updated;Equipment recommendations need to be updated    Co-evaluation              AM-PAC PT "6 Clicks" Mobility   Outcome Measure  Help needed turning from your back to your side while in a flat bed without using bedrails?: A Little Help needed moving from lying on your back to sitting on the side of a flat bed without using bedrails?: A Little Help needed moving to and from a bed to a chair (including a wheelchair)?: A Little Help needed standing up from a chair using your arms (e.g., wheelchair or bedside chair)?: A Little Help needed to walk in hospital room?: A Little Help needed climbing 3-5  steps with a railing? : A Little 6 Click Score: 18    End of Session Equipment Utilized During Treatment: Gait belt Activity Tolerance: Patient tolerated treatment well Patient left: in chair;with call bell/phone within reach;with chair alarm set;with family/visitor present Nurse Communication: Mobility status PT Visit Diagnosis: Unsteadiness on feet (R26.81);Other abnormalities of gait and mobility (R26.89);Muscle weakness (generalized) (M62.81);Difficulty in walking, not elsewhere classified (R26.2);Other symptoms and signs involving the nervous system (R29.898)     Time: 3643-8377 PT Time Calculation (min) (ACUTE ONLY): 20 min  Charges:  $Gait Training: 8-22 mins                     Raymond Gurney, PT, DPT Acute Rehabilitation Services  Pager: [phone] Office: [phone]    Jewel Baize 05/05/2020, 3:02 PM

## 2020-05-05 NOTE — TOC Transition Note (Signed)
Transition of Care [facility]) - CM/SW Discharge Note   Patient Details  Name: Michelle Lucas MRN: [id number] Date of Birth: [birth year]  Transition of Care [facility]) CM/SW Contact:  Bess Kinds, RN Phone Number: [phone] 05/05/2020, 1:52 PM   Clinical Narrative:     Patient to transition home today. [facility] contacted for charity [facility] PT/OT - referral pending qualifying status. [employer] contacted for RW. Spoke with patient at the bedside to discuss potential home health therapy. Discussed RW to be delivered to bedside and advised that she will be provided hardship form to complete. Discussed follow up with local clinic resources to establish for primary care. Patient verbalized understanding. Sent chat message to Dr. Idalia Needle to discuss home health f/u orders post discharge d/t patient not currently established with outpt PCP. Dr. Idalia Needle agreeable for f/u [facility] needs. Patient requested to have form from her employer completed by MD - Dr. Idalia Needle agreeable and will have one of her colleagues to follow up today. Patient has transportation home. No DC medications. No further TOC needs identified at this time.   Final next level of care: Home w Home Health Services Barriers to Discharge: No Barriers Identified   Patient Goals and CMS Choice Patient states their goals for this hospitalization and ongoing recovery are:: return home CMS Medicare.gov Compare Post Acute Care list provided to:: Patient Choice offered to / list presented to : Patient  Discharge Placement                       Discharge Plan and Services   Discharge Planning Services: CM Consult Post Acute Care Choice: IP Rehab          DME Arranged: Dan Humphreys DME Agency: [employer] Date DME Agency Contacted: 05/05/20 Time DME Agency Contacted: 1221 Representative spoke with at DME Agency: Francesco Sor Arranged: PT,OT HH Agency: [facility] Date [facility] Agency Contacted: 05/05/20 Time HH Agency Contacted: 1351 Representative spoke with  at [facility] Agency: Kandee Keen - referral pending qualifying for charity  Social Determinants of Health (SDOH) Interventions     Readmission Risk Interventions No flowsheet data found.

## 2020-05-05 NOTE — Discharge Summary (Signed)
Family Medicine Teaching [facility] Discharge Summary  Patient name: Michelle Lucas Medical record number: [id number] Date of birth: [birth year] Age: 30 y.o. Gender: female Date of Admission: 05/03/2020  Date of Discharge: 05/05/20 Admitting Physician: Billey Co, MD  Primary Care Provider: Patient, No Pcp Per Consultants: Neuro   Indication for Hospitalization: right sided weakness   Discharge Diagnoses/Problem List:  Right sided weakness due to psychosomatic cause Hypokalemia, resolved  Disposition: Home   Discharge Condition: Improved   Discharge Exam:  Temp:  [97.5 F (36.4 C)-98.5 F (36.9 C)] 97.9 F (36.6 C) (02/18 0736) Pulse Rate:  [68-77] 68 (02/18 0736) Resp:  [16-18] 16 (02/18 0736) BP: (98-122)/(55-75) 98/67 (02/18 0736) SpO2:  [100 %] 100 % (02/18 0736) Weight:  [100 kg] 100 kg (02/18 0500)  Physical Exam: General: well appearing, NAD Cardiovascular: RRR no murmurs Respiratory: CTAB. Normal WOB Abdomen: soft, non distended, non tender  MSK: Strength 4/5 R upper and lower extremities. Reduced grip strength  R ankle inversion without erythema or edema Neuro: CN II- XII in tact, but with reduced sensation on R side of body that does cross midline  Psych: alert, oriented. Appropriate mood and affect  Brief Hospital Course:   Michelle Lucas is a 30 y.o. female who presented with right sided weakness and diminished sensation likely due to conversion disorder.  PMH is significant for Migraines, Anxiety/Depression/Adjustment disorder, Pseudoseizures and Asthma.   Right Side Weakness likely due to psychosomatic cause Patient presented to ED with right-sided numbness and weakness of face, arm, leg starting midnight prior to admission.  Patient cannot recall having these symptoms previously; however, was seen in the ED on 12/20/2019 for seizure-like activity. CTA head with focal severe stenosis of the right distal P2 PCA, although patient's symptoms are on right  side. Brain MRI with no visible acute intracranial abnormality, though limited due to artifact from patient's braces. CT head without evidence of acute intracranial abnormality. UDS positive only for THC. Hgb normal. CMP remarkable only for BUN <5 and total bilirubin 1.7.  CBC unremarkable.  Admission physical exam with no cranial nerve deficits, and with R sided arm and leg weakness and diminished sensation of R side of face, arm, and leg. R ankle inverted, though non tender to palpation and without erythema. X ray of foot negative. Magnesium 1.8, vitamin B12 211.  TSH normal at 1.584. HIV negative, RPR negative.  Neuro was consulted and signed off after eliminating a potential neurological etiology, with imaging and labs ruling out any concerning medical causes. Patient's symptoms likely due to psychosomatic cause such as conversion disorder.   By day of discharge, patient symptoms had improved, though not at baseline.  Patient discharged home with PT/OT and recommended follow up with primary care physician and neurologist     Issues for Follow Up:  1. Follow up with neurologist. Patient has hx of pseudoseizures and now with this right sided weakness. 2. Follow up with PCP to establish care and consider starting SSRI and therapy for anxiety/depression   Significant Procedures:  none  Significant Labs and Imaging:  Recent Labs  Lab 05/03/20 1120 05/03/20 1135 05/04/20 0335  WBC 6.1  --  5.8  HGB 12.9 13.9 12.2  HCT 39.0 41.0 38.9  PLT 296  --  277   Recent Labs  Lab 05/03/20 1120 05/03/20 1135 05/03/20 1654 05/04/20 0335  NA 138 139  --  137  K 3.8 3.9  --  3.2*  CL 106 103  --  104  CO2 22  --   --  23  GLUCOSE 90 85  --  87  BUN <5* 4*  --  5*  CREATININE 0.82 0.80  --  0.85  CALCIUM 9.3  --   --  9.3  MG  --   --  1.8  --   PHOS  --   --   --  4.8*  ALKPHOS 70  --   --   --   AST 22  --   --   --   ALT 14  --   --   --   ALBUMIN 3.5  --   --   --      Results/Tests  Pending at Time of Discharge: none  Discharge Medications:  Allergies as of 05/05/2020      Reactions   Ciprofloxacin Hives   Latex Swelling   Swelling per patient   Penicillins Swelling   Swelling at injection site Has patient had a PCN reaction causing immediate rash, facial/tongue/throat swelling, SOB or lightheadedness with hypotension: Yes Has patient had a PCN reaction causing severe rash involving mucus membranes or skin necrosis: No Has patient had a PCN reaction that required hospitalization: No Has patient had a PCN reaction occurring within the last 10 years: Yes If all of the above answers are "NO", then may proceed with Cephalosporin use.   Shrimp [shellfish Allergy]       Medication List    TAKE these medications   albuterol 108 (90 Base) MCG/ACT inhaler Commonly known as: VENTOLIN HFA Inhale 2 puffs into the lungs every 6 (six) hours as needed for wheezing or shortness of breath.            Durable Medical Equipment  (From admission, onward)         Start     Ordered   05/05/20 1141  For home use only DME Walker  Once       Question:  Patient needs a walker to treat with the following condition  Answer:  Weakness   05/05/20 1141          Discharge Instructions: Please refer to Patient Instructions section of EMR for full details.  Patient was counseled important signs and symptoms that should prompt return to medical care, changes in medications, dietary instructions, activity restrictions, and follow up appointments.   Follow-Up Appointments:  Follow-up Information    [facility] Follow up.   Why: Please call to schedule an appointment. You may have to fill out paperwork prior to first appointment. Contact information: [phone]  [address] [place], [place] [address]       [facility] Follow up.   Specialty: Primary Care Why: Please call and schedule an appointment. You may have to fill out paperwork prior to  first appointment. Contact information: [address] [place] [address] [phone]              Cora Collum, DO 05/05/2020, 11:44 AM PGY-1, [facility] Family [facility]

## 2022-11-13 IMAGING — MR MR HEAD W/O CM
8 of 10 series · 36 of 48 positions shown · non-contrast
Comparison: Same day CT code stroke imaging.

CLINICAL DATA: Neuro deficit, acute stroke suspected.

EXAM:
MRI HEAD WITHOUT CONTRAST
TECHNIQUE: Multiplanar, multiecho pulse sequences of the brain and surrounding
structures were obtained without intravenous contrast.

[Series 3: DWI · axial · 3.0mm · 1.09mm/px · z∈[-89,+43]mm · 9 of 94 slices shown (1 of 4)]
[im 1/94]
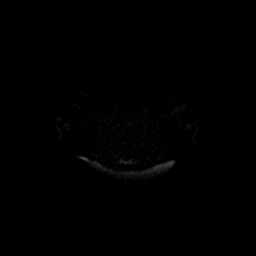
[im 12/94]
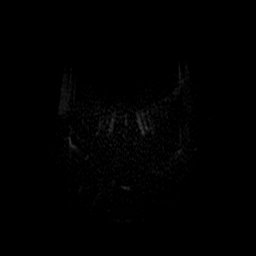
[im 24/94]
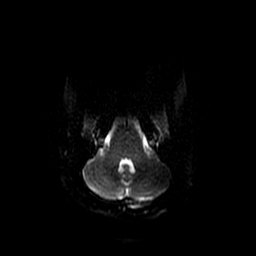
[im 35/94]
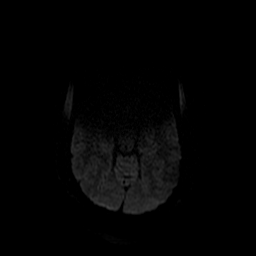
[im 47/94]
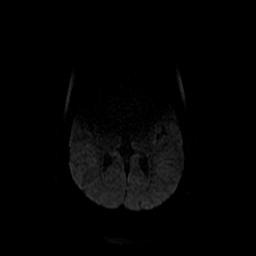
[im 59/94]
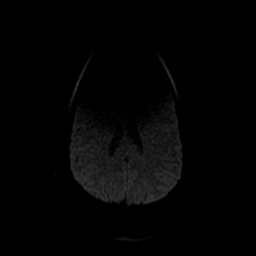
[im 70/94]
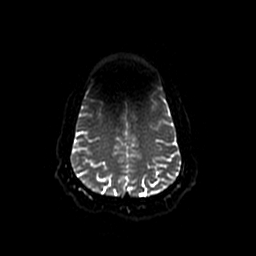
[im 82/94]
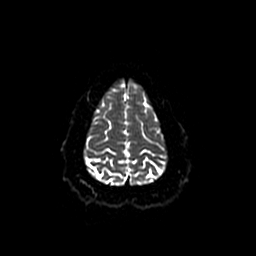
[im 94/94]
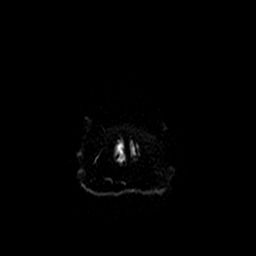

[Series 4: DWI · coronal · 5.0mm · 1.09mm/px · 7 of 72 slices shown (2 of 4)]
[im 1/72]
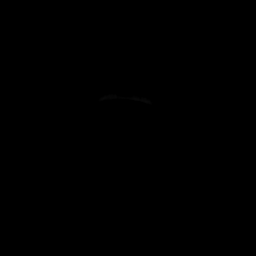
[im 12/72]
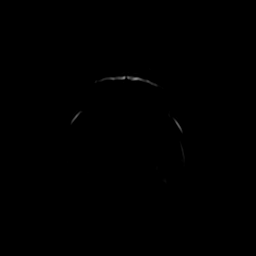
[im 24/72]
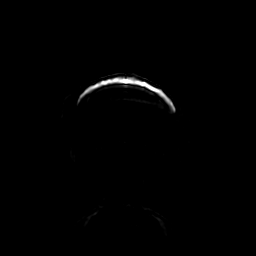
[im 36/72]
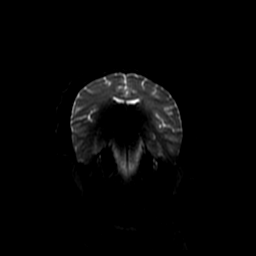
[im 48/72]
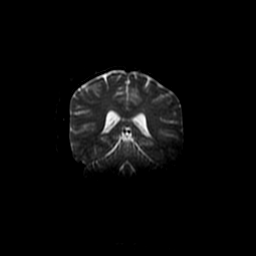
[im 60/72]
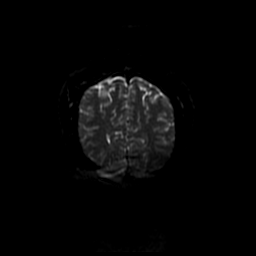
[im 72/72]
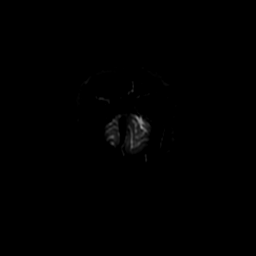

[Series 5: T1 · sagittal · 5.0mm · 0.47mm/px · 2 of 23 slices shown]
[im 1/23]
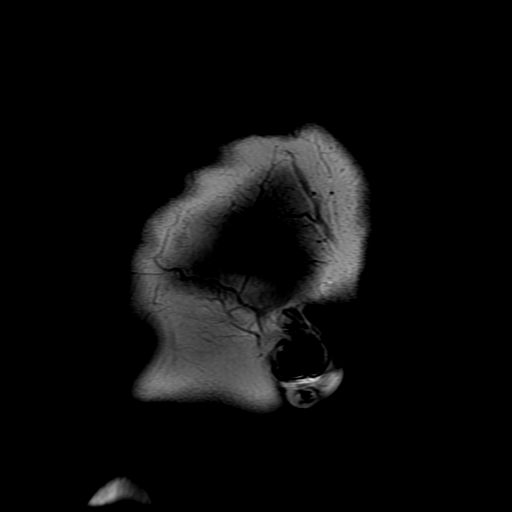
[im 23/23]
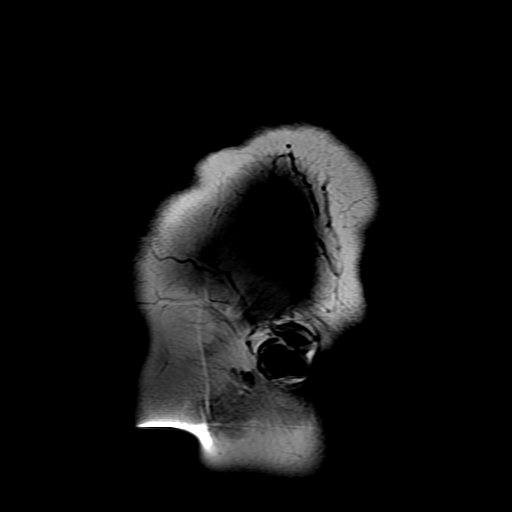

[Series 6: T2 · axial · 5.0mm · 0.43mm/px · z∈[-85,+53]mm · 3 of 25 slices shown (1 of 2)]
[im 1/25]
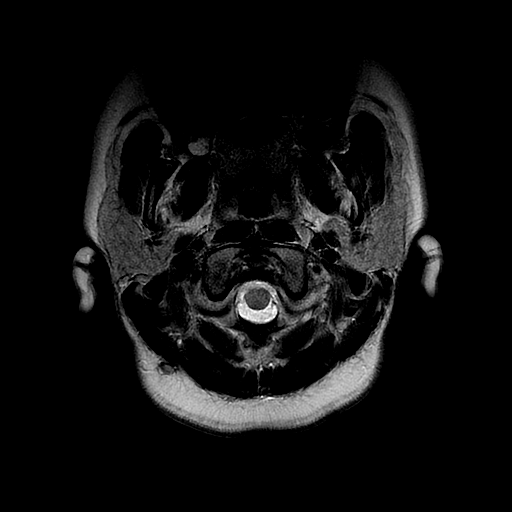
[im 13/25]
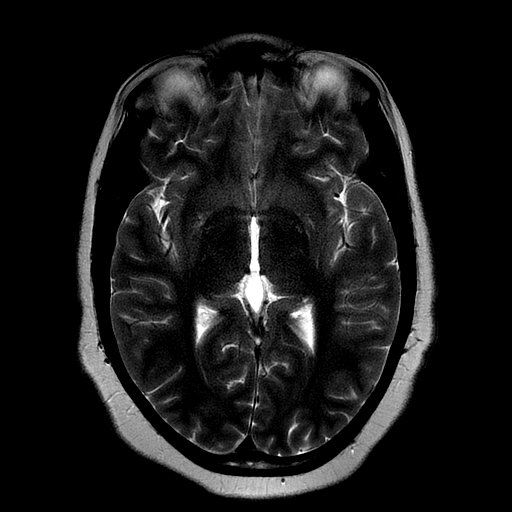
[im 25/25]
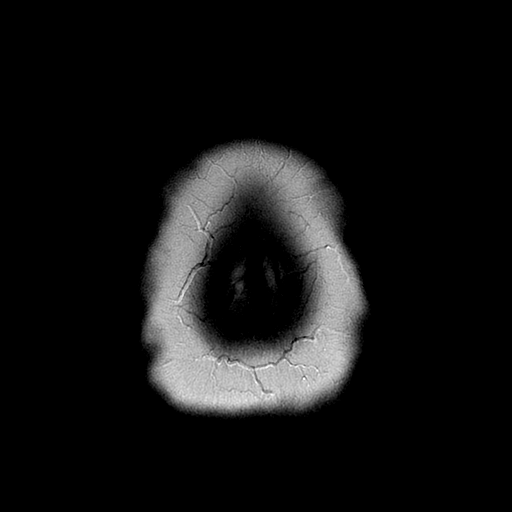

[Series 7: FLAIR · axial · 3.0mm · 0.43mm/px · z∈[-85,+53]mm · 3 of 25 slices shown]
[im 1/25]
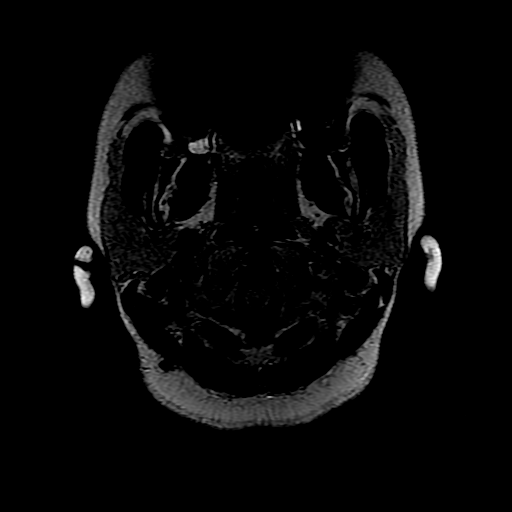
[im 13/25]
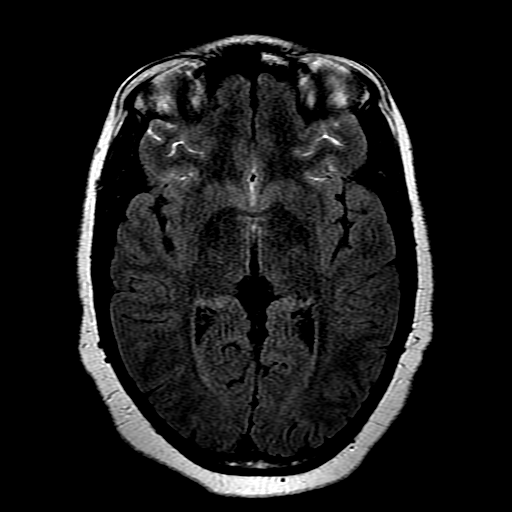
[im 25/25]
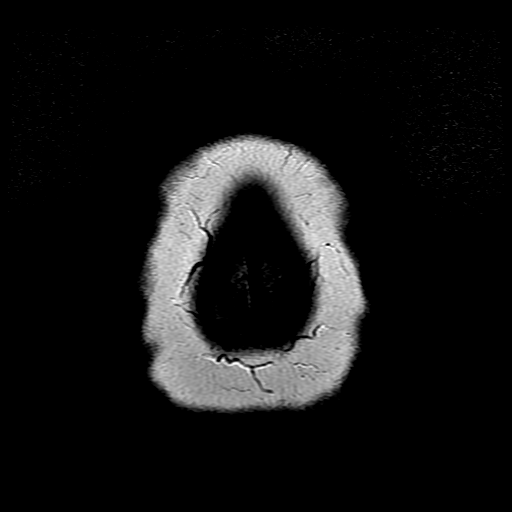

[Series 12: T2 · coronal · 5.0mm · 0.43mm/px · 3 of 31 slices shown (2 of 2)]
[im 1/31]
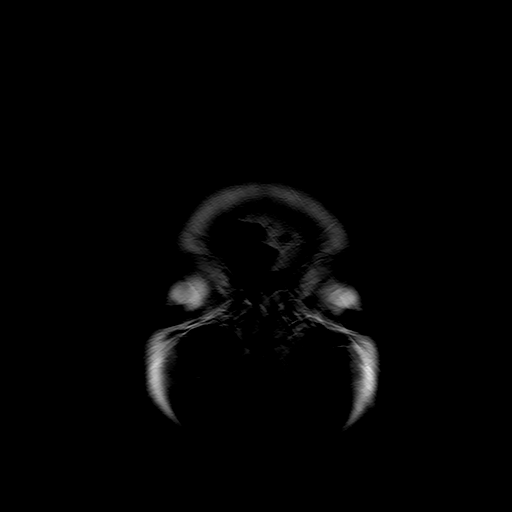
[im 16/31]
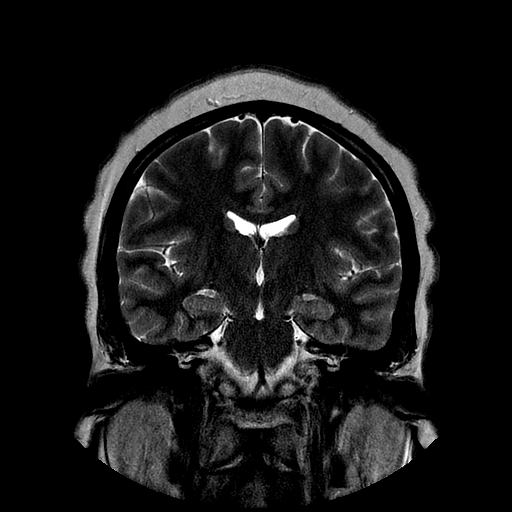
[im 31/31]
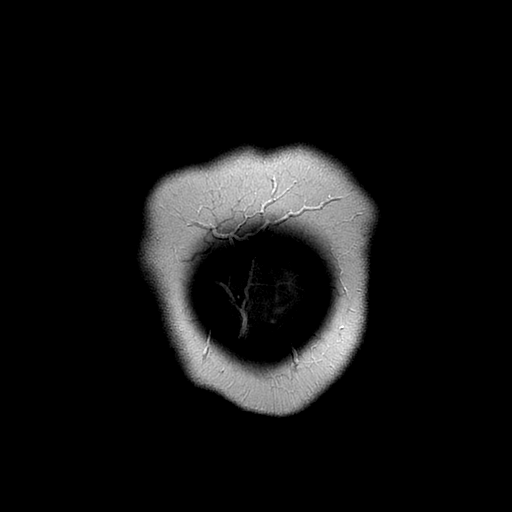

[Series 300: DWI · axial · 3.0mm · 1.09mm/px · z∈[-89,+43]mm · 5 of 47 slices shown (3 of 4)]
[im 1/47]
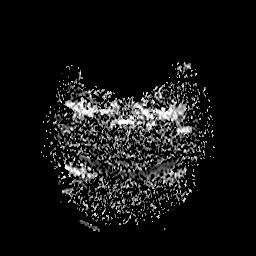
[im 12/47]
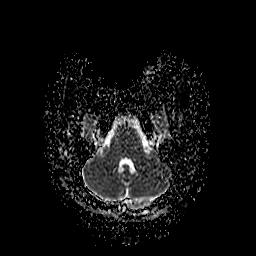
[im 24/47]
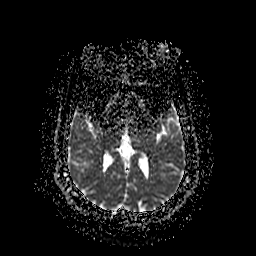
[im 35/47]
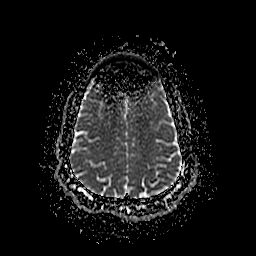
[im 47/47]
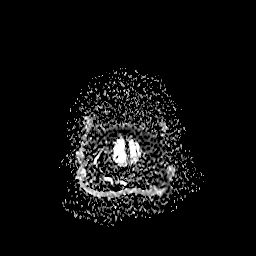

[Series 400: DWI · coronal · 5.0mm · 1.09mm/px · 4 of 36 slices shown (4 of 4)]
[im 1/36]
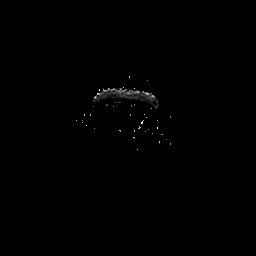
[im 12/36]
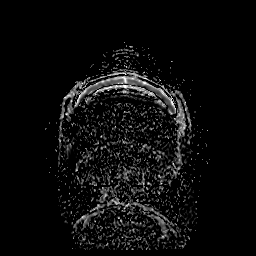
[im 24/36]
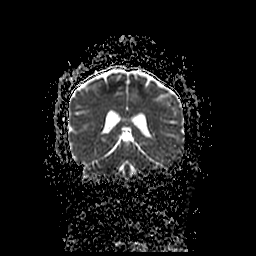
[im 36/36]
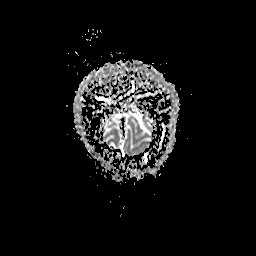

[36 of 48 positions shown; findings below may reference images not displayed]

FINDINGS: Artifact from the patient's braces obscures a large portion of the
supratentorial brain anteriorly, the anterior brainstem and inferior
cerebellum on diffusion and susceptibility imaging. Some of the
other sequences are limited by artifact anteriorly. Within these
limitations:

Brain: No evidence of acute infarction, acute hemorrhage,
hydrocephalus, extra-axial collection or mass lesion. Incomplete
FLAIR suppression anteriorly secondary to artifact.

Vascular: Major arterial flow voids are maintained at the skull
base.

Skull and upper cervical spine: Normal marrow signal.

Sinuses/orbits: Largely obscured by artifact on most sequences.
There is evidence of mild mucosal thickening in the sinuses.

Other: No mastoid effusions.
IMPRESSION: Artifact from the patient's braces significantly limits evaluation
and obscures a large portion of the brain on diffusion and
susceptibility imaging, as detailed above. Within this limitation,
no visible acute intracranial abnormality.
